# Patient Record
Sex: Male | Born: 1955
Health system: Southern US, Community
[De-identification: ages and names within clinical notes are randomized; demographics above are authoritative.]

## PROBLEM LIST (undated history)

## (undated) DIAGNOSIS — Z460 Encounter for fitting and adjustment of spectacles and contact lenses: Secondary | ICD-10-CM

## (undated) DIAGNOSIS — E785 Hyperlipidemia, unspecified: Secondary | ICD-10-CM

## (undated) DIAGNOSIS — N400 Enlarged prostate without lower urinary tract symptoms: Secondary | ICD-10-CM

## (undated) DIAGNOSIS — I10 Essential (primary) hypertension: Secondary | ICD-10-CM

## (undated) DIAGNOSIS — C801 Malignant (primary) neoplasm, unspecified: Secondary | ICD-10-CM

## (undated) HISTORY — PX: TONSILLECTOMY: SUR1361

## (undated) HISTORY — PX: COLONOSCOPY: SHX174

---

## 2002-04-03 ENCOUNTER — Ambulatory Visit (HOSPITAL_BASED_OUTPATIENT_CLINIC_OR_DEPARTMENT_OTHER): Admission: RE | Admit: 2002-04-03 | Discharge: 2002-04-03 | Payer: Self-pay | Admitting: Internal Medicine

## 2007-03-24 HISTORY — PX: ORIF ANKLE FRACTURE: SHX5408

## 2007-06-30 ENCOUNTER — Ambulatory Visit (HOSPITAL_COMMUNITY): Admission: RE | Admit: 2007-06-30 | Discharge: 2007-07-01 | Payer: Self-pay | Admitting: Orthopedic Surgery

## 2007-06-30 IMAGING — CR DG CHEST 2V
2 series · 2 of 2 positions shown · non-contrast
Comparison: None available

CLINICAL DATA: Left ankle fracture, preop

CHEST - 2 VIEW

[view not recorded (1 of 2)]
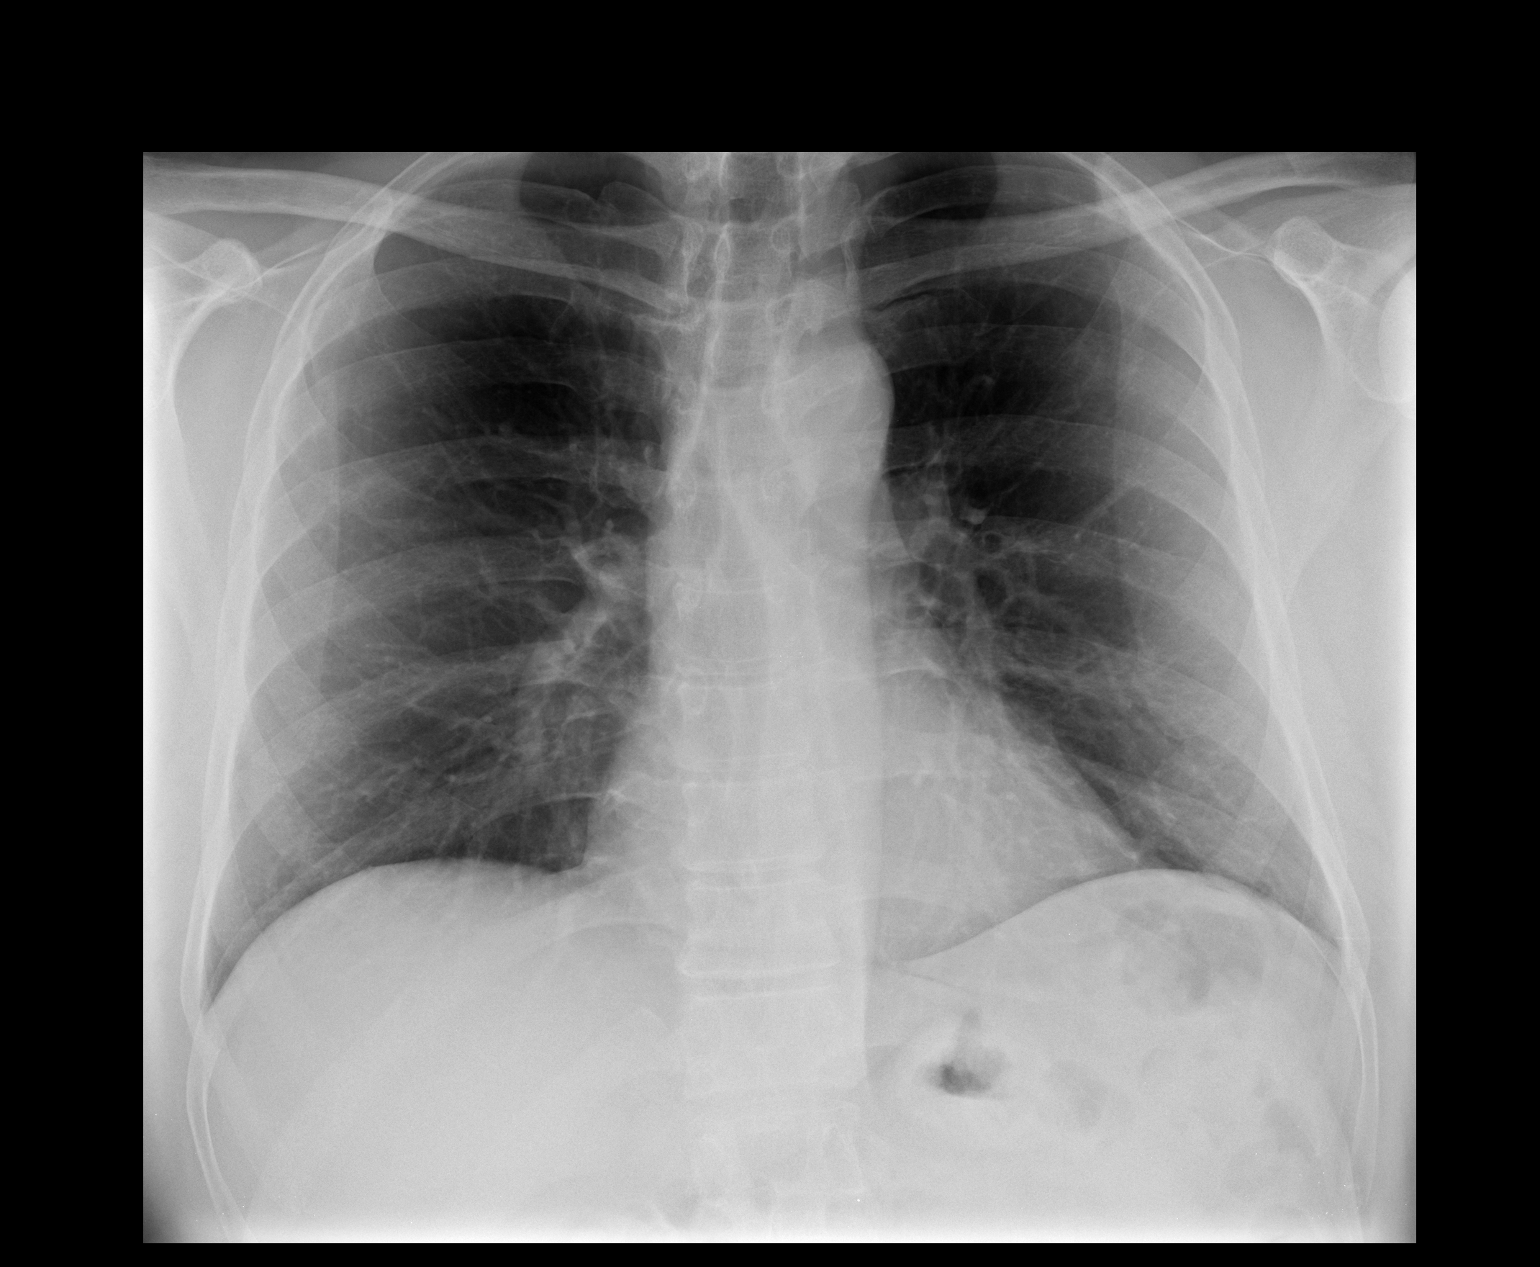

[view not recorded (2 of 2)]
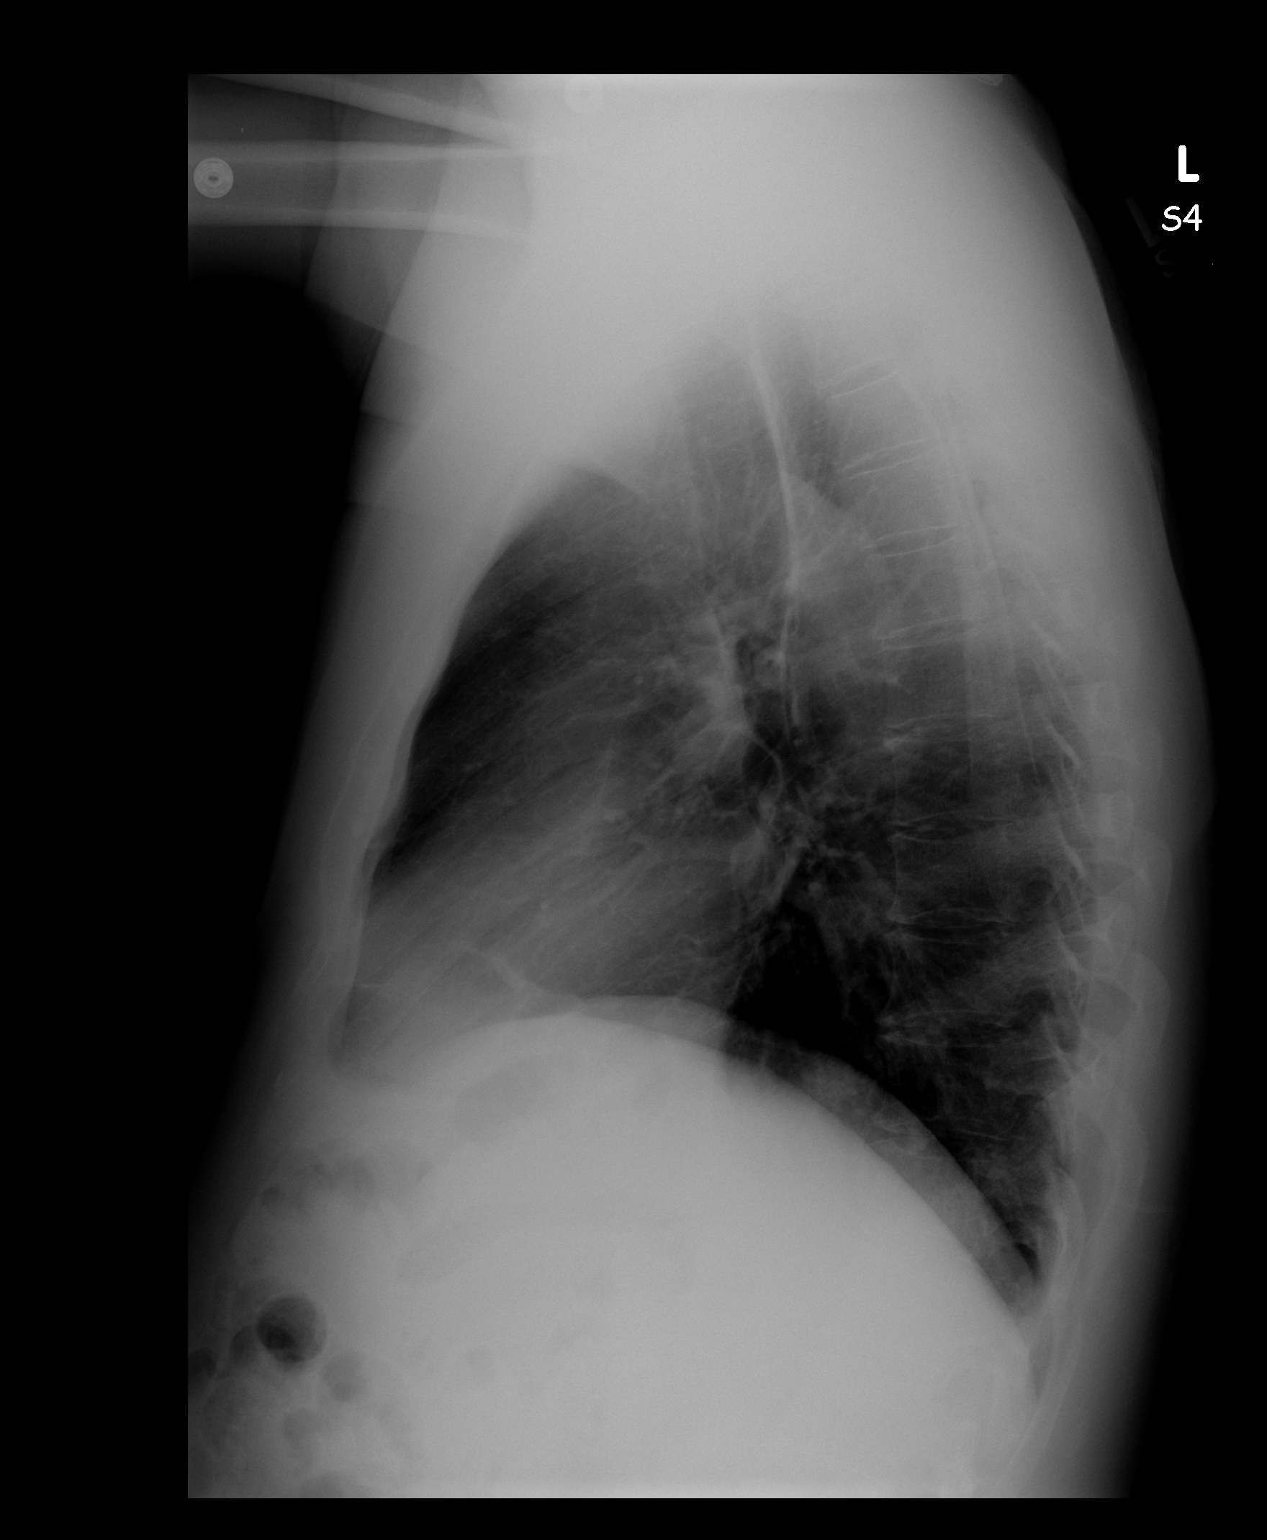

[2 of 2 positions shown; findings below may reference images not displayed]

FINDINGS: Lungs clear.  Heart size and pulmonary vascularity
normal.  No effusion.  Visualized bones unremarkable.]
IMPRESSION: [1.  No acute disease

## 2007-06-30 IMAGING — CR DG FOOT 2V*L*
1 series · 1 of 1 positions shown · non-contrast
Comparison: None

CLINICAL DATA: ORIF talar fracture

LEFT FOOT - 2 VIEW

[view not recorded]
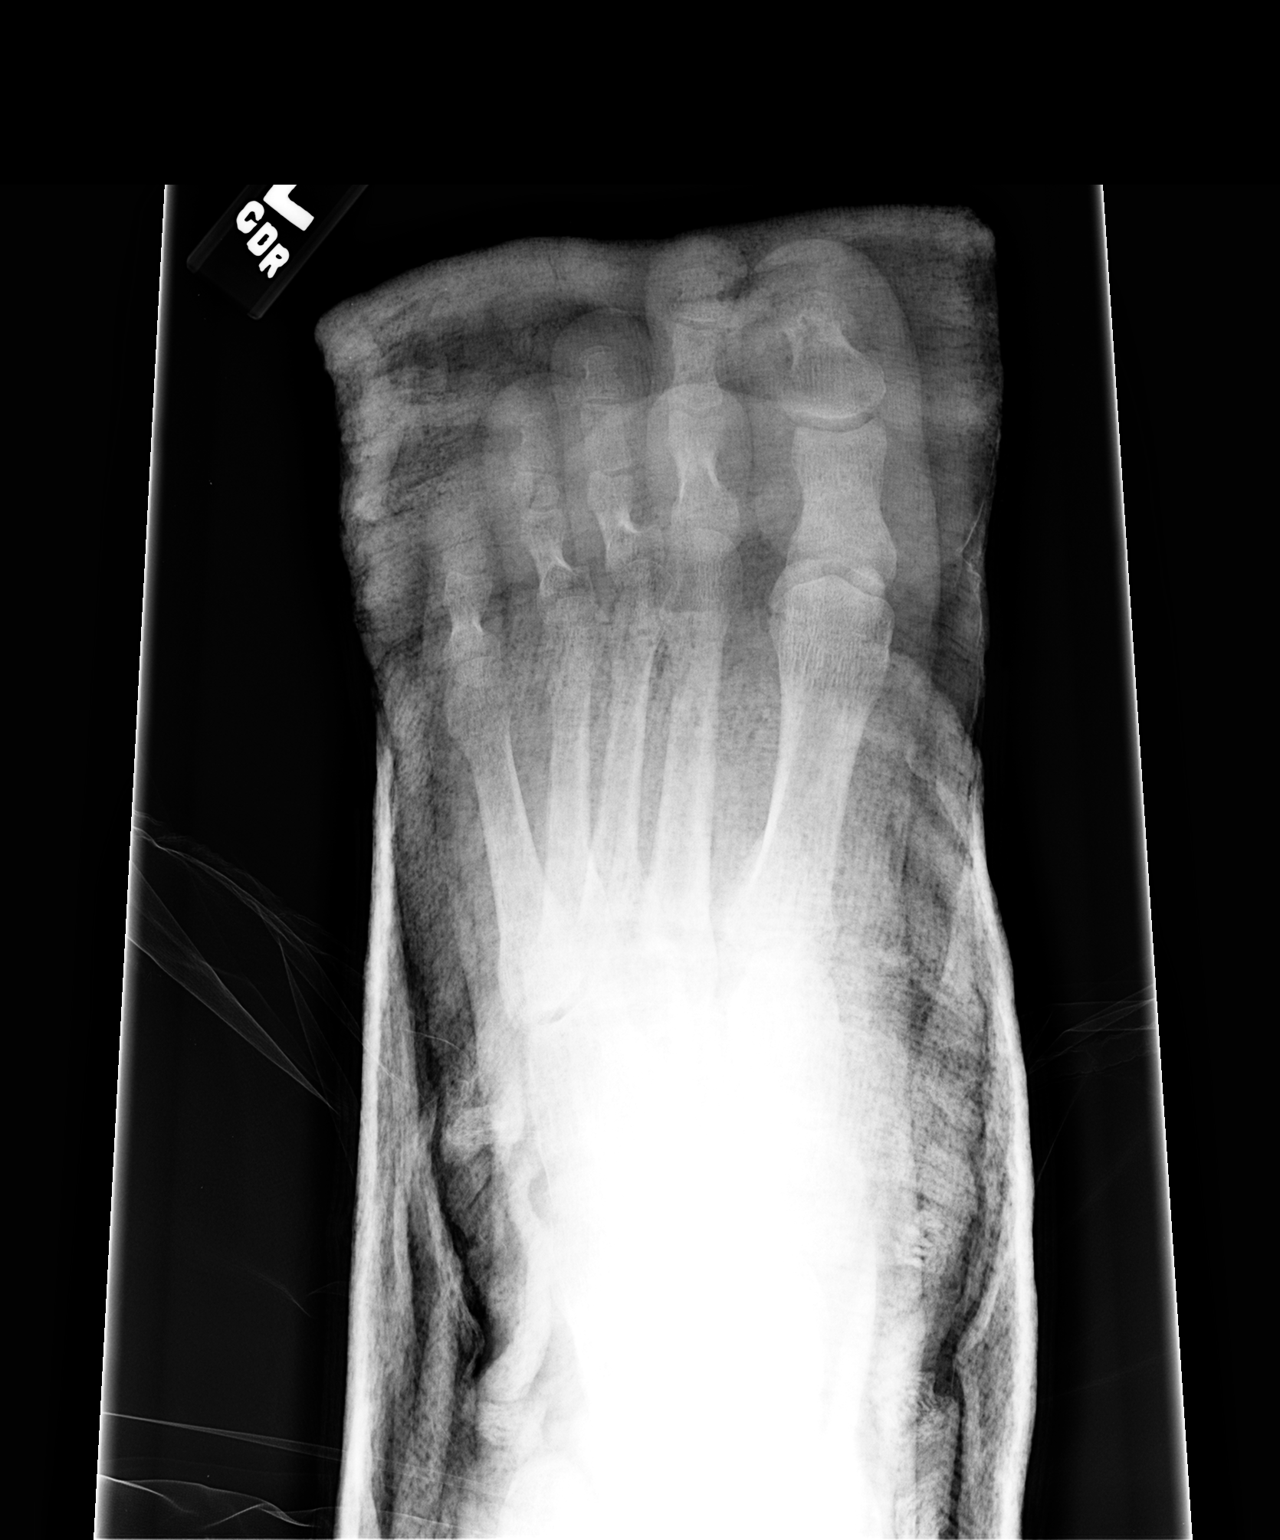

[1 of 1 positions shown; findings below may reference images not displayed]

FINDINGS: A single AP view of the foot is obtained.  The patient is
in a plaster splint.  Screw plate fixation of a talar fracture
identified.  The hardware is satisfactory appearance.
The alignment is anatomic
IMPRESSION: Satisfactory appearance status post ORIF left talar fracture.

## 2007-06-30 IMAGING — CR DG ANKLE PORT 2V*L*
1 series · 1 of 1 positions shown · non-contrast
Comparison: None

CLINICAL DATA: ORIF left talar fracture.

PORTABLE LEFT ANKLE - 2 VIEW

[view not recorded]
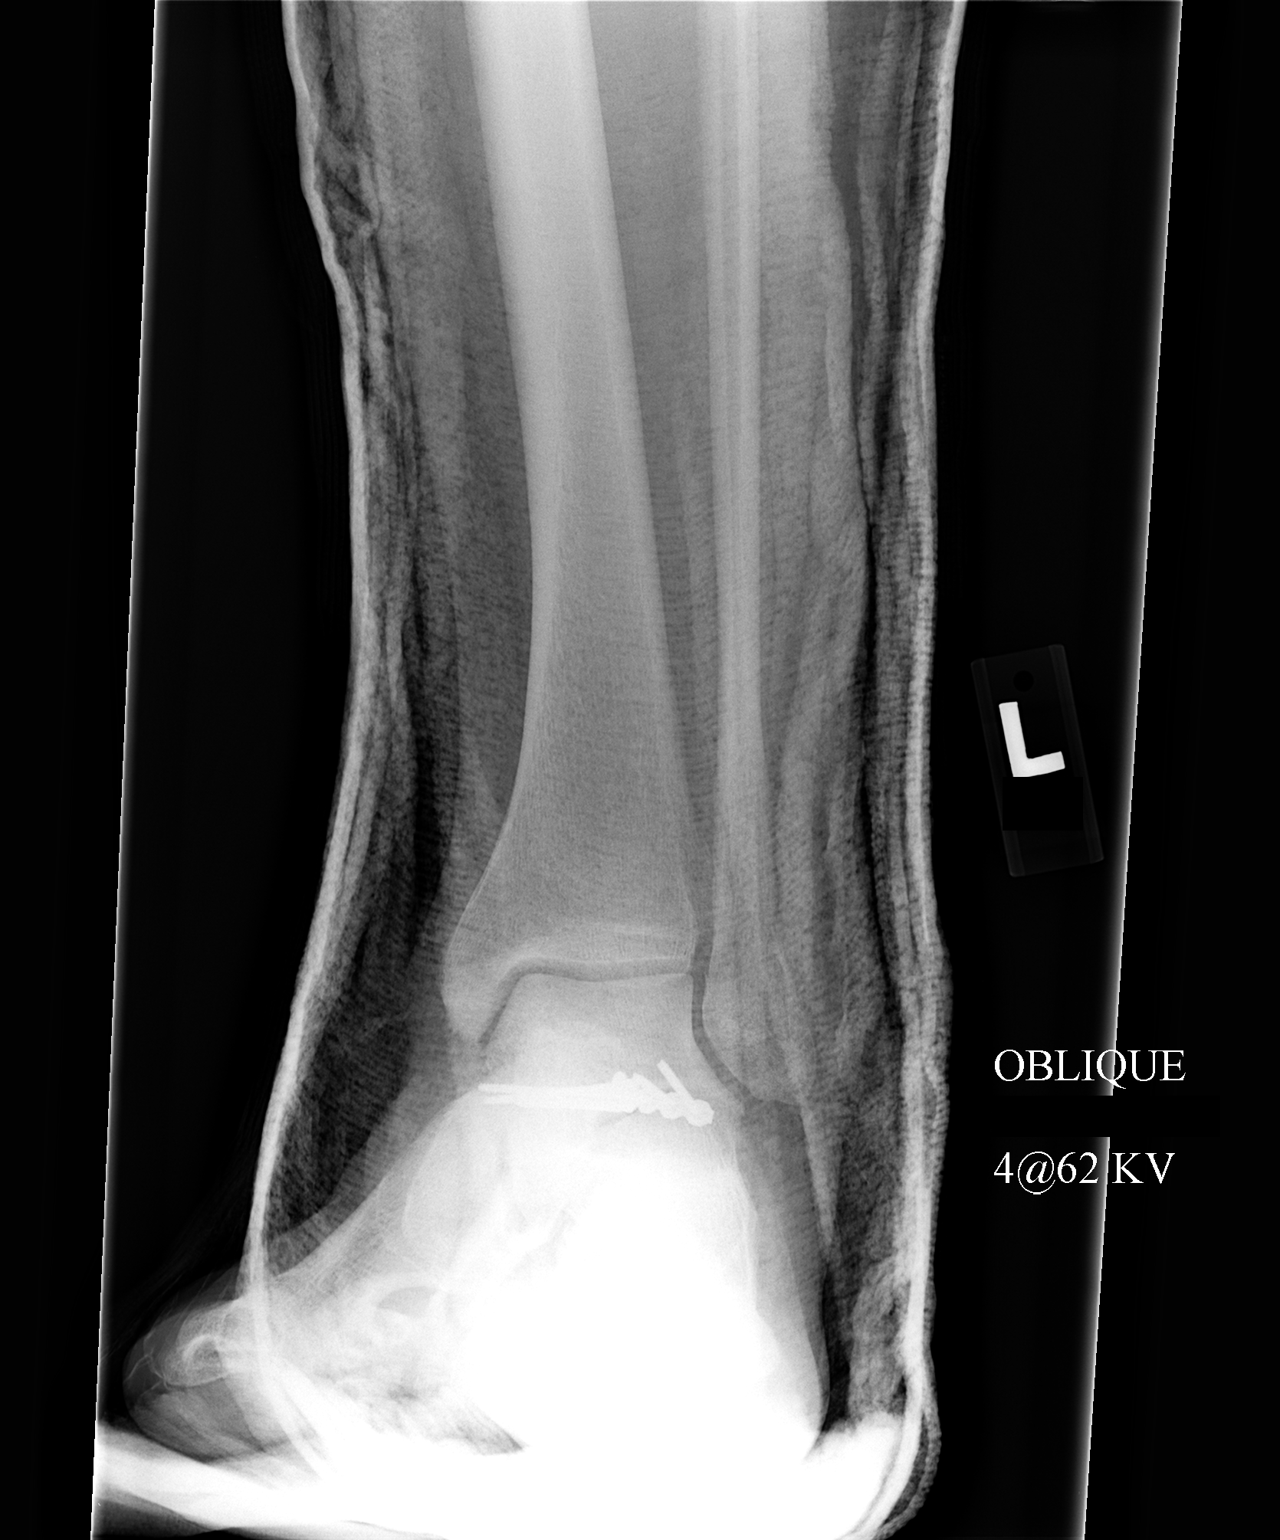

[1 of 1 positions shown; findings below may reference images not displayed]

FINDINGS: The patient is in a plaster splint.  Screw plate fixation
of the talus is identified.  The alignment anatomic.  The
tibiotalar articulation is normal without joint effusion.
IMPRESSION: Satisfactory appearance status post ORIF talar neck fracture.

## 2007-06-30 IMAGING — CR DG ANKLE PORT 2V*L*
1 series · 1 of 1 positions shown · non-contrast
Comparison: None

CLINICAL DATA: ORIF left talar fracture.

PORTABLE LEFT ANKLE - 2 VIEW

[view not recorded]
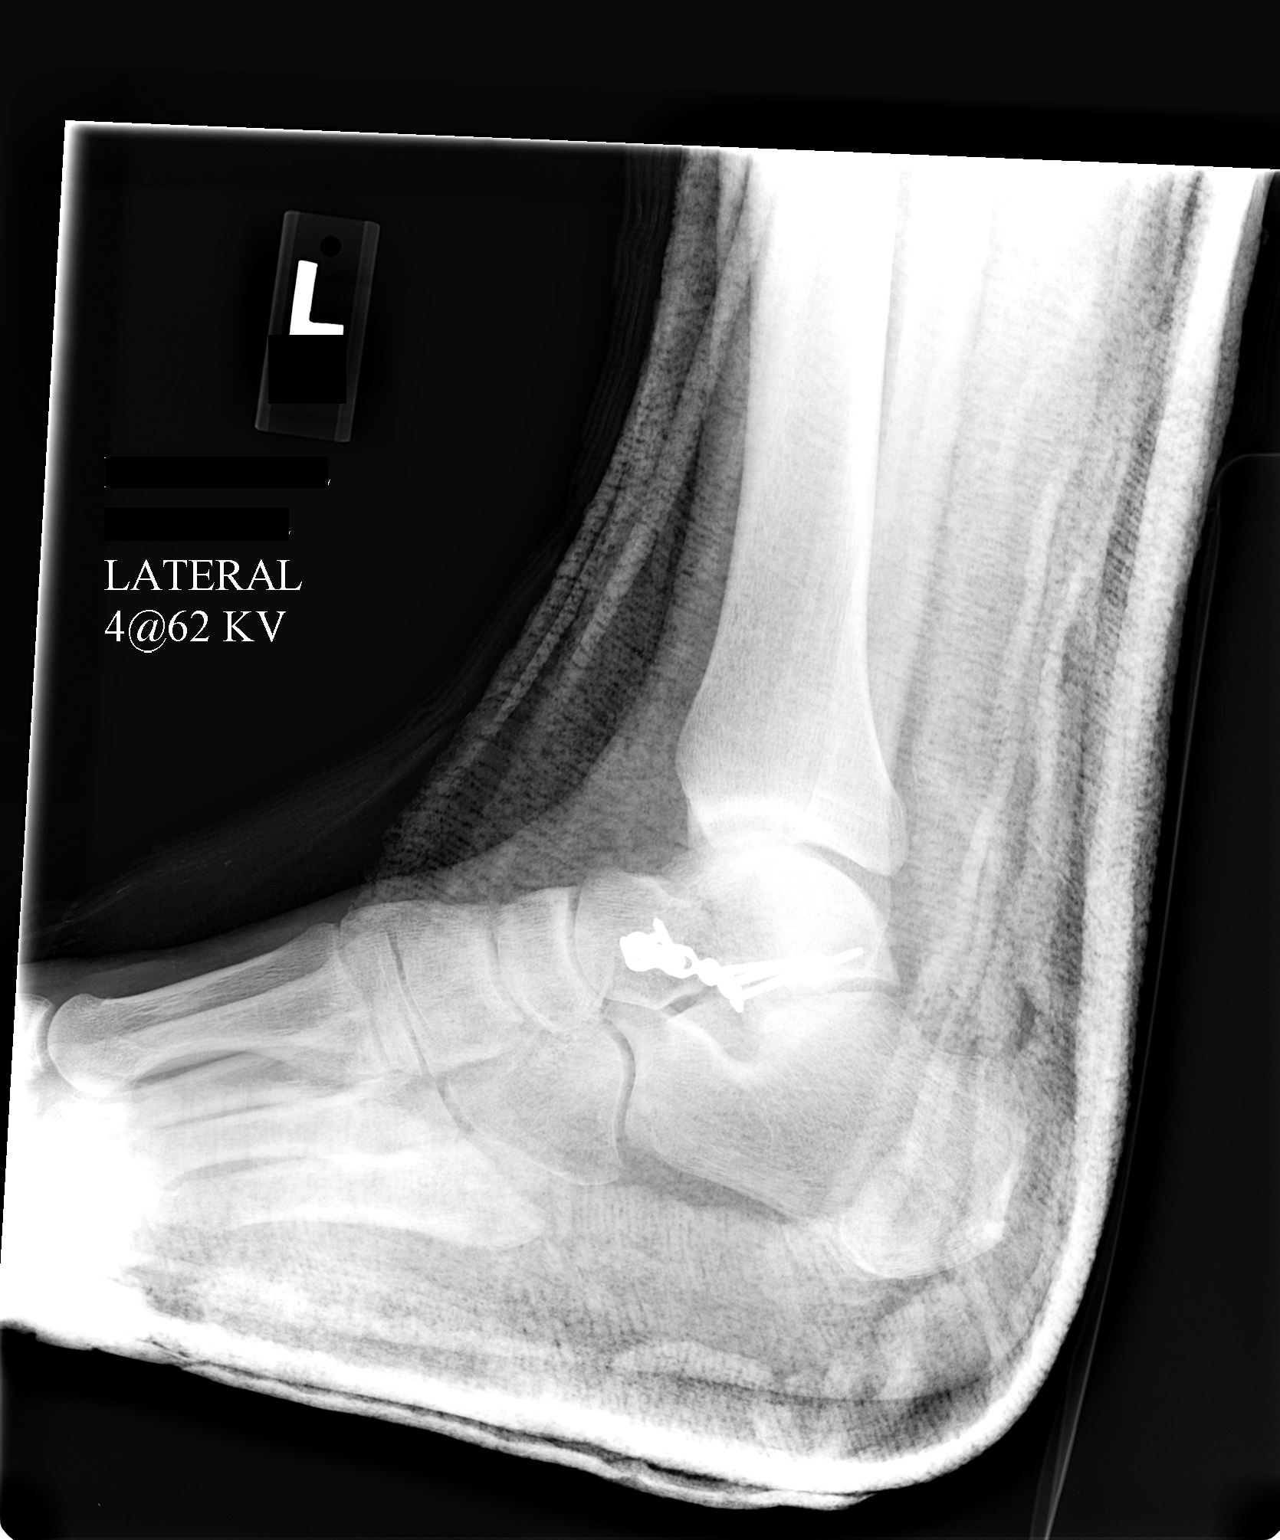

[1 of 1 positions shown; findings below may reference images not displayed]

FINDINGS: The patient is in a plaster splint.  Screw plate fixation
of the talus is identified.  The alignment anatomic.  The
tibiotalar articulation is normal without joint effusion.
IMPRESSION: Satisfactory appearance status post ORIF talar neck fracture.

## 2007-06-30 IMAGING — RF DG ANKLE COMPLETE 3+V*L*
1 series · 6 of 6 positions shown · non-contrast
Comparison: None

CLINICAL DATA: Left talus fracture

LEFT ANKLE COMPLETE - 3+ VIEW

[Series 1: run · 6 of 6 slices shown]
[im 1/6]
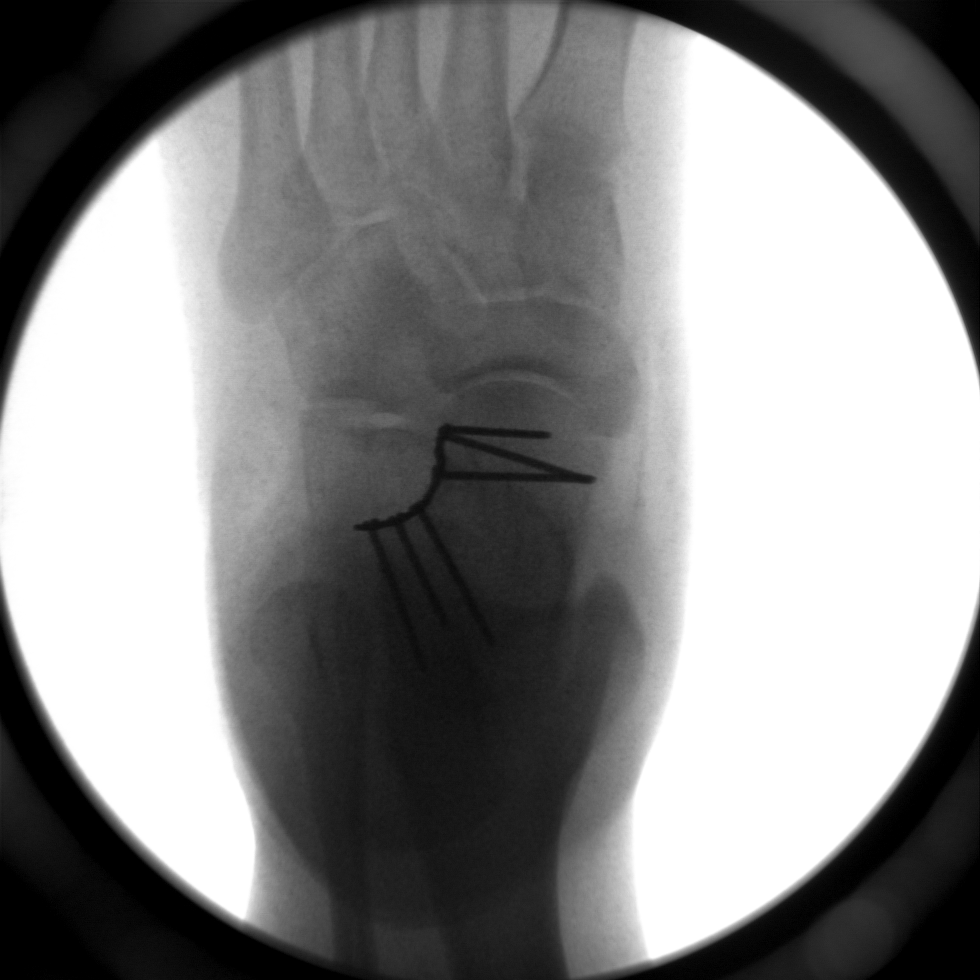
[im 2/6]
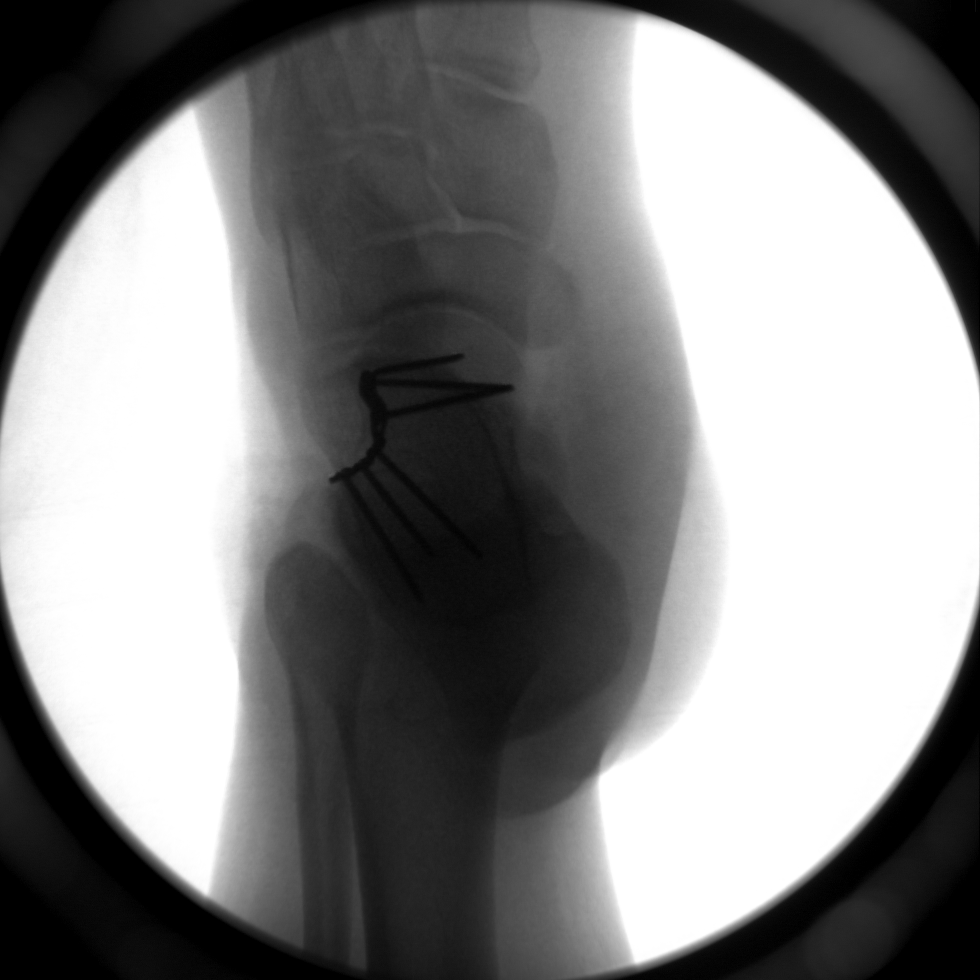
[im 3/6]
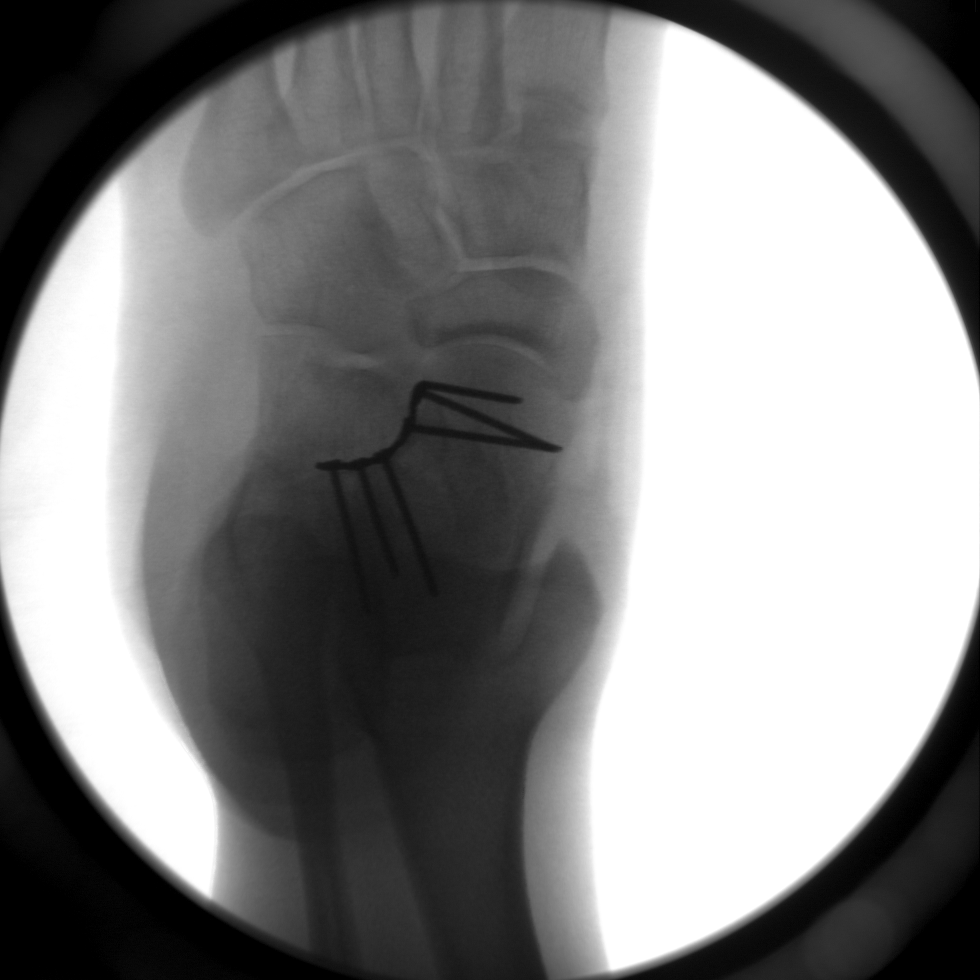
[im 4/6]
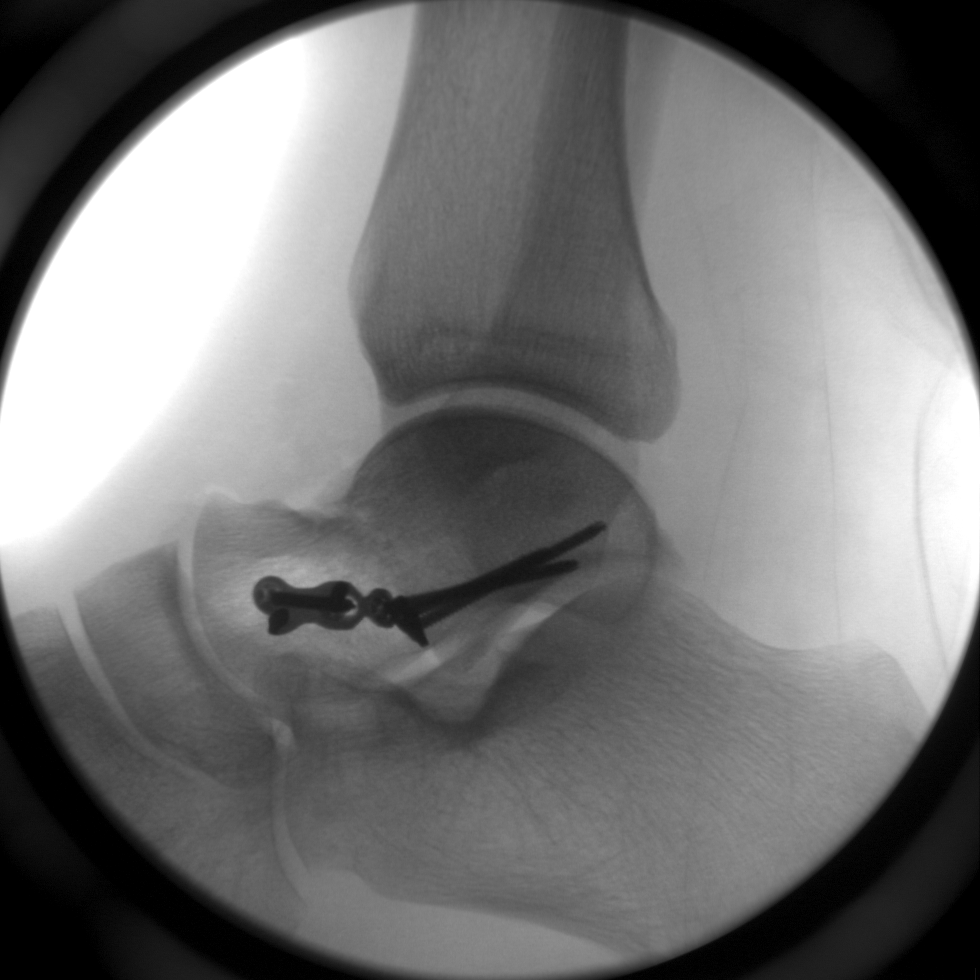
[im 5/6]
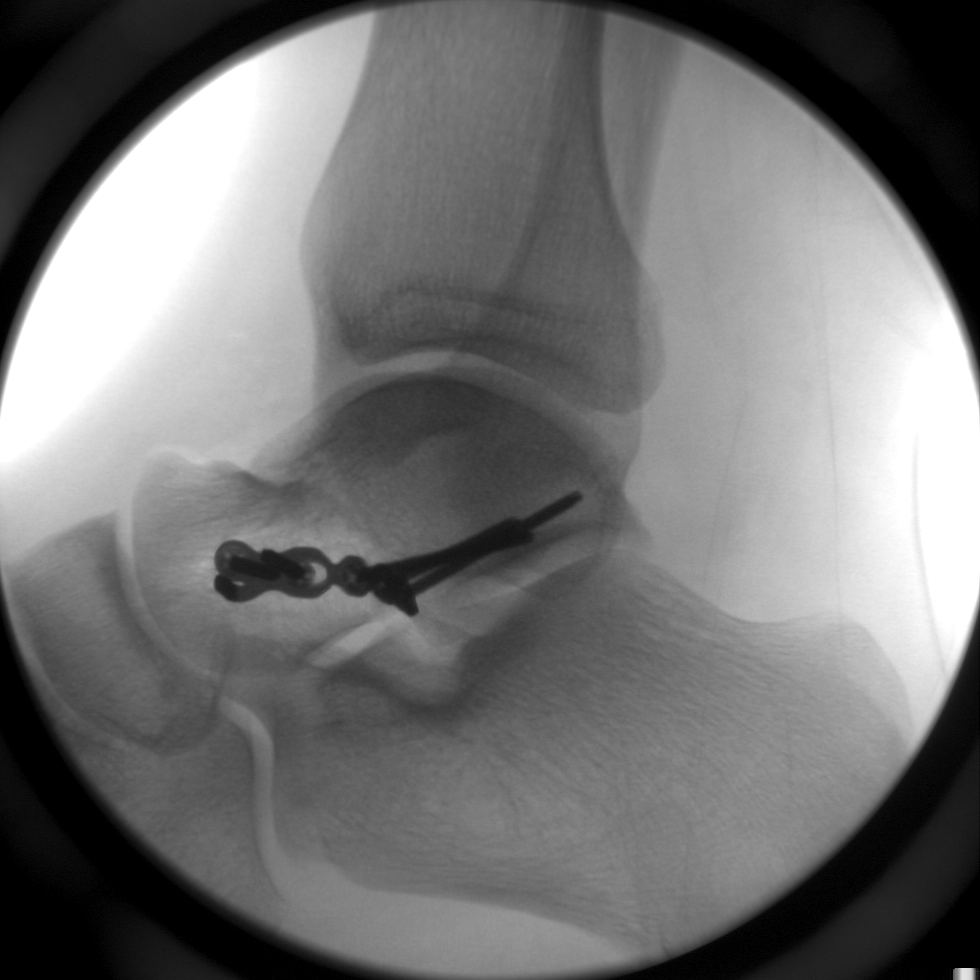
[im 6/6]
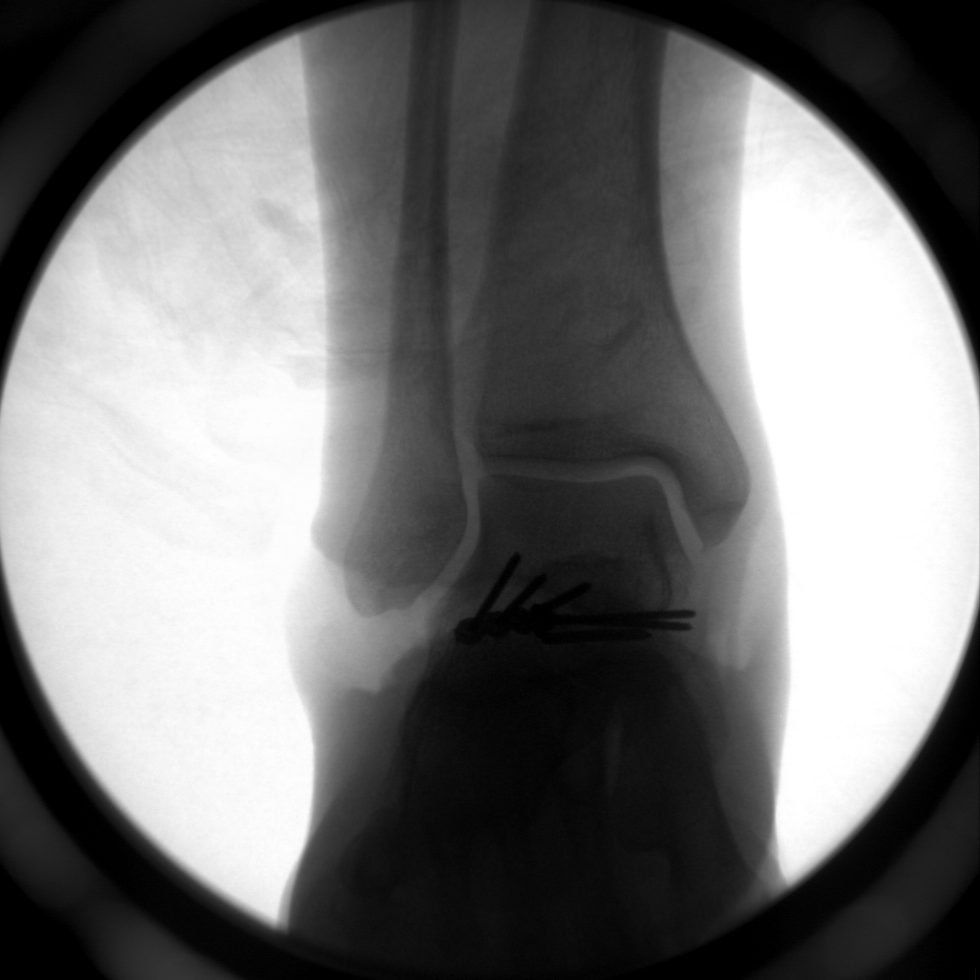

[6 of 6 positions shown; findings below may reference images not displayed]

FINDINGS: There is a mini plate and screws transfixing a talar neck
fracture.  Anatomic alignment and no complicating features.
IMPRESSION: 1.  Internal fixation of the talar fracture with anatomic alignment
and no complicating features.

## 2007-08-16 ENCOUNTER — Encounter: Admission: RE | Admit: 2007-08-16 | Discharge: 2007-08-16 | Payer: Self-pay | Admitting: Orthopedic Surgery

## 2007-08-16 IMAGING — CR DG FOOT 2V*L*
2 series · 2 of 2 positions shown · non-contrast
Comparison: Postop films of [DATE].

CLINICAL DATA: .

LEFT FOOT - 2 VIEW

[view not recorded (1 of 2)]
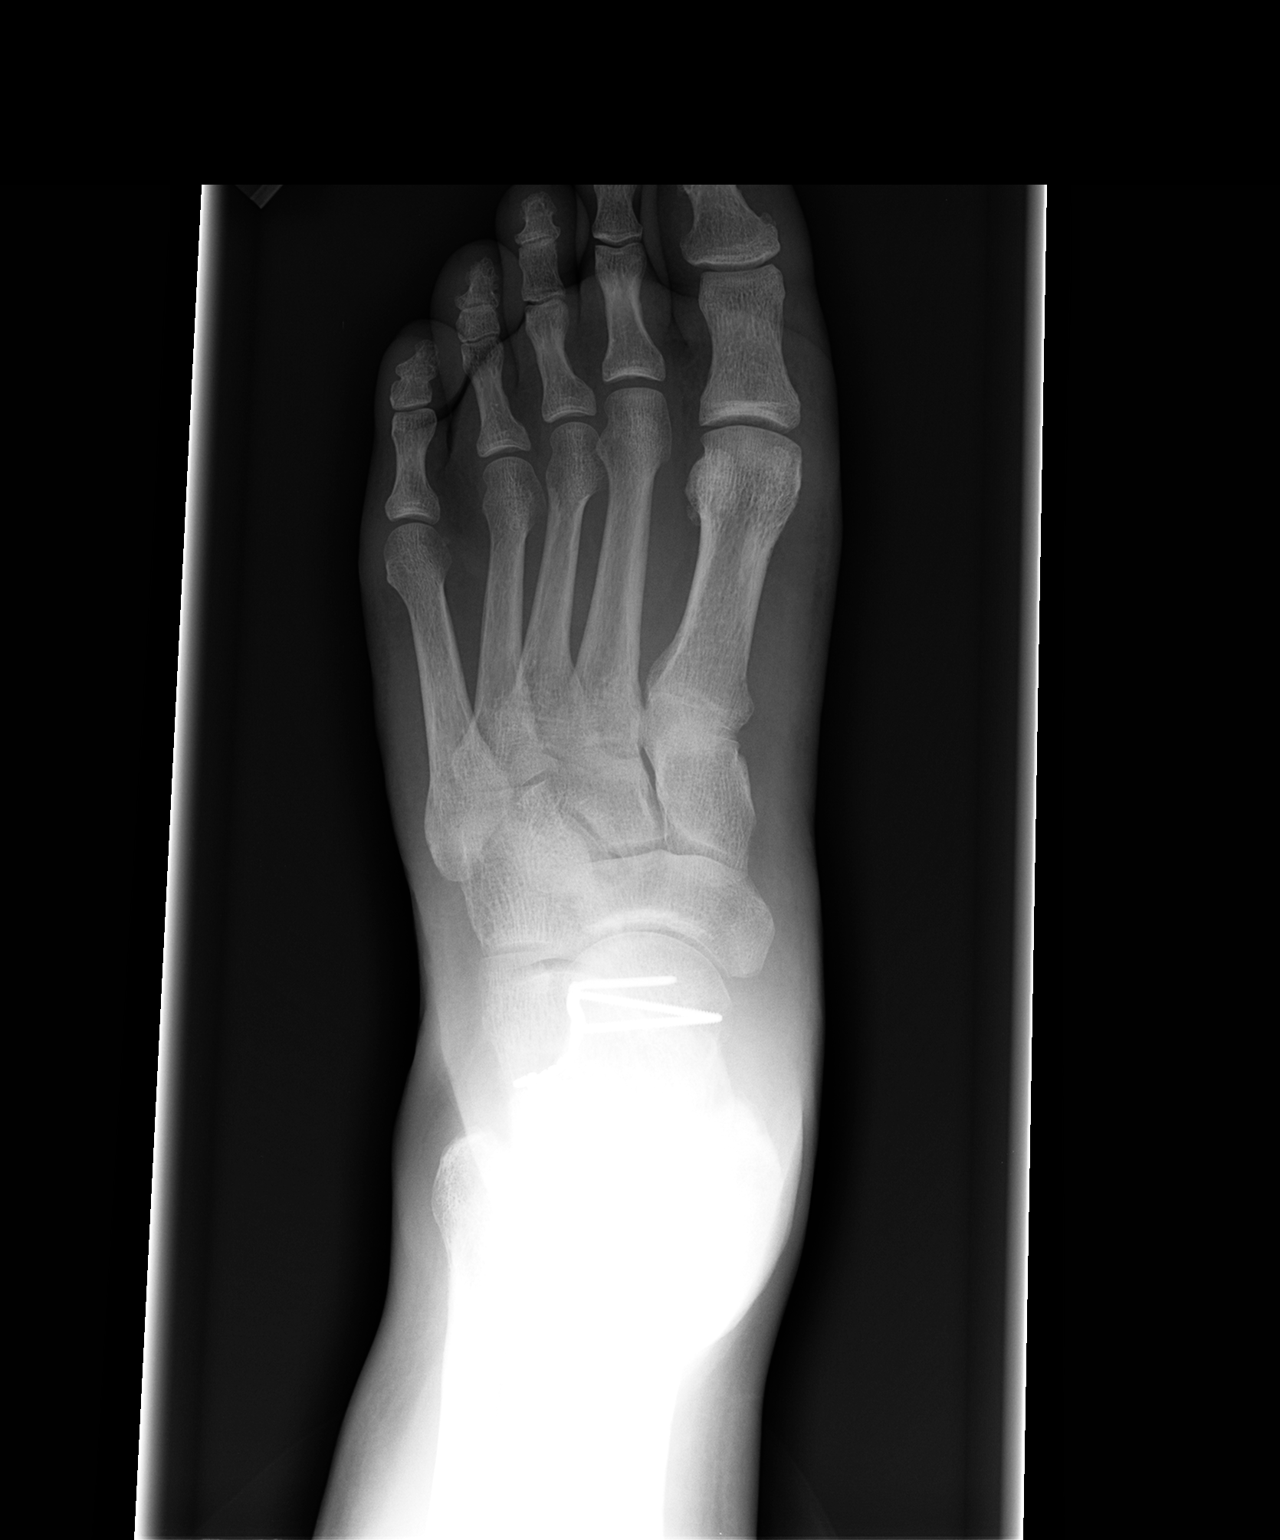

[view not recorded (2 of 2)]
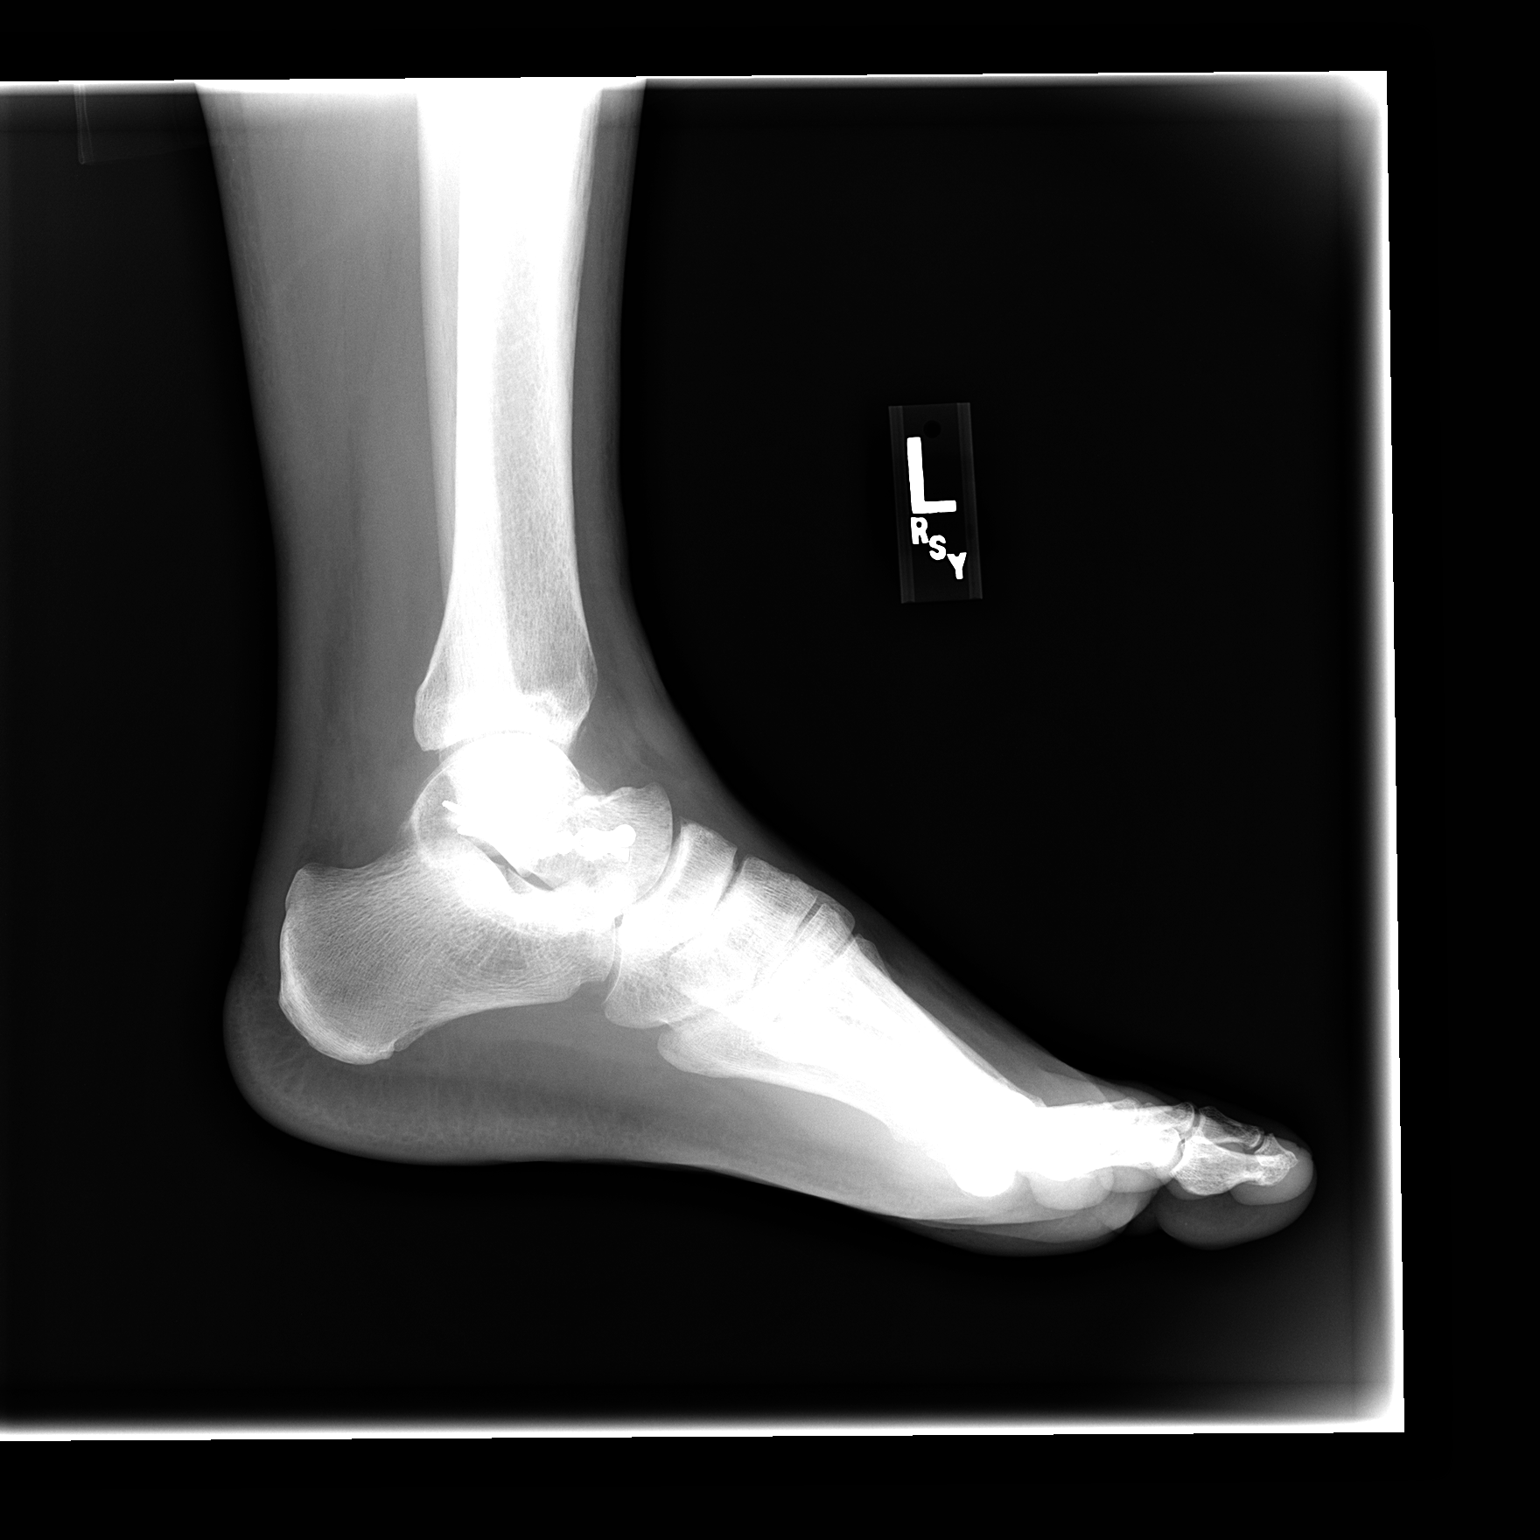

[2 of 2 positions shown; findings below may reference images not displayed]

FINDINGS: Plate and screw fixation of the mid talar fracture is
noted.  No change in alignment is seen.  There is some sclerosis
along the fracture line most likely due to some interval healing.
No other acute abnormality is seen
IMPRESSION: Some sclerosis along the talar fracture site most likely due to
some interval healing.  No change in appearance of the talar
fracture after fixation.

## 2007-08-16 IMAGING — CR DG ANKLE 2V *L*
2 series · 2 of 2 positions shown · non-contrast
Comparison: None

CLINICAL DATA: History of recent left talus fracture, evaluate for
healing,  some pain and swelling

LEFT ANKLE - 2 VIEW

[view not recorded (1 of 2)]
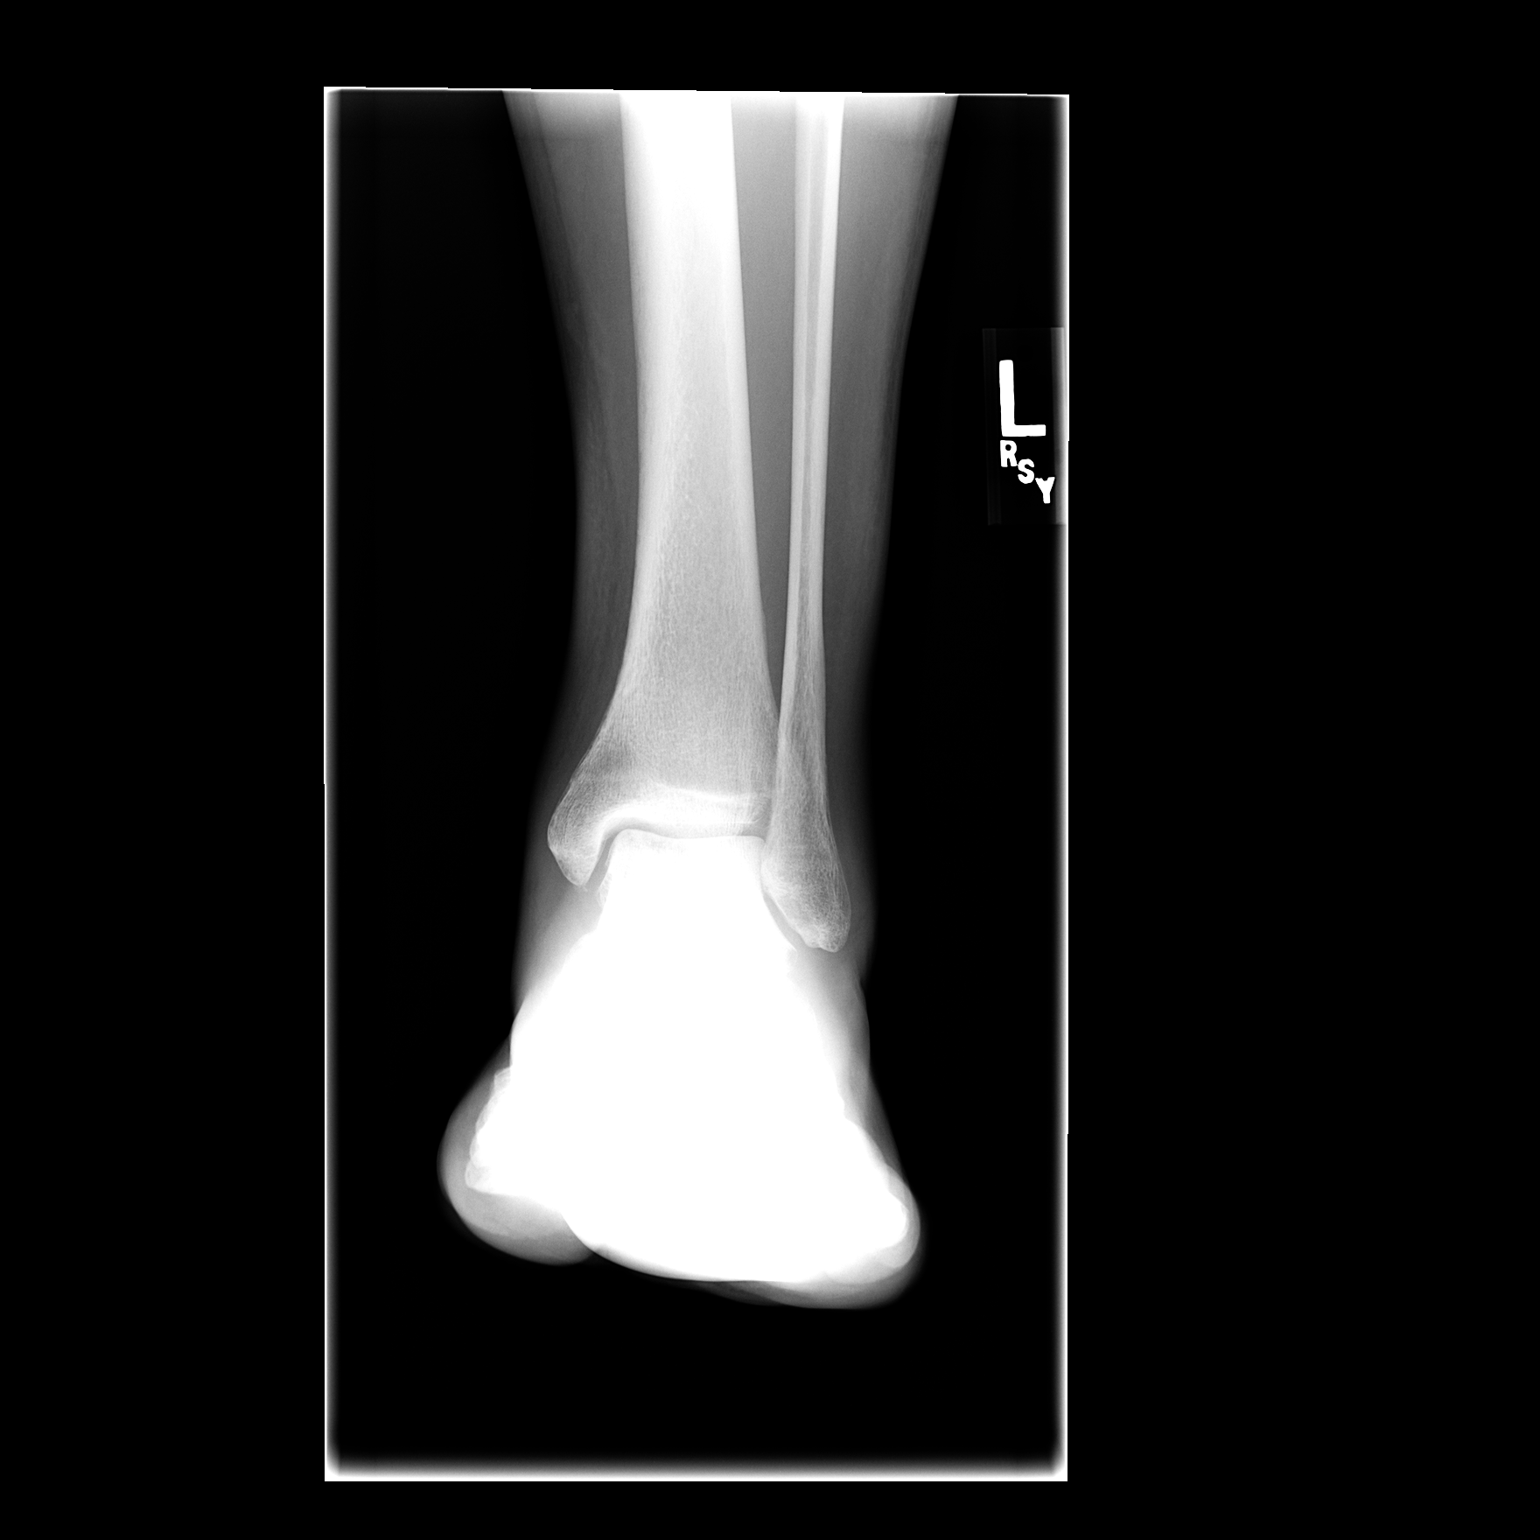

[view not recorded (2 of 2)]
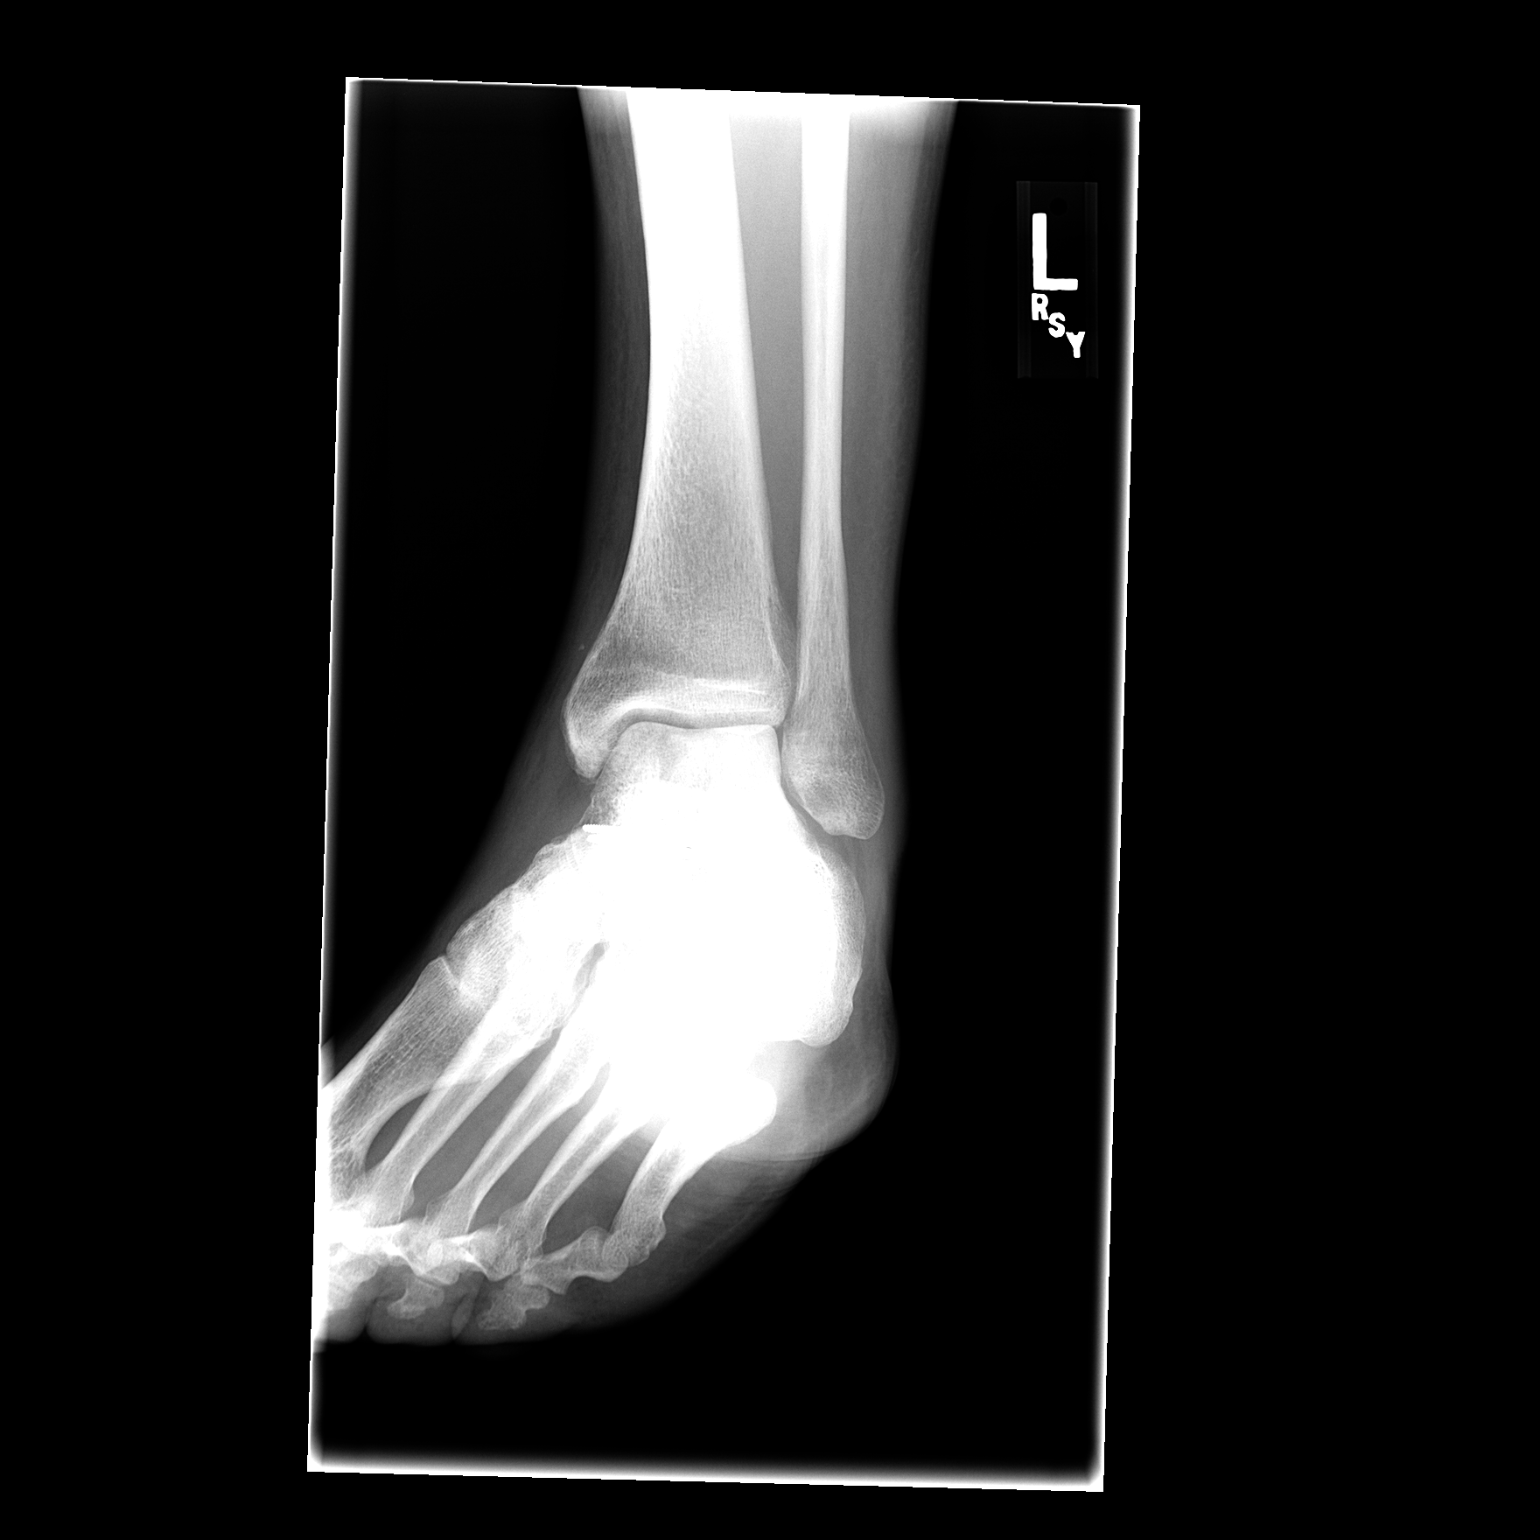

[2 of 2 positions shown; findings below may reference images not displayed]

FINDINGS: Two views of the left ankle show plate and screws for
fixation of the talar fracture.  The ankle joint appears normal.
There is some demineralization of the distal left tibia and fibula
most likely due to disuse.  Normal alignment is present on the
views obtained.
IMPRESSION: Mild osteopenia with ankle most likely due to disuse.  The ankle
joint appears normal.

## 2007-09-02 ENCOUNTER — Encounter: Admission: RE | Admit: 2007-09-02 | Discharge: 2007-09-02 | Payer: Self-pay | Admitting: Orthopedic Surgery

## 2007-09-02 IMAGING — CR DG ANKLE 2V *L*
1 series · 1 of 1 positions shown · non-contrast
Comparison: Films of [DATE]

CLINICAL DATA: Left talar fracture

LEFT ANKLE - 2 VIEW

[view not recorded]
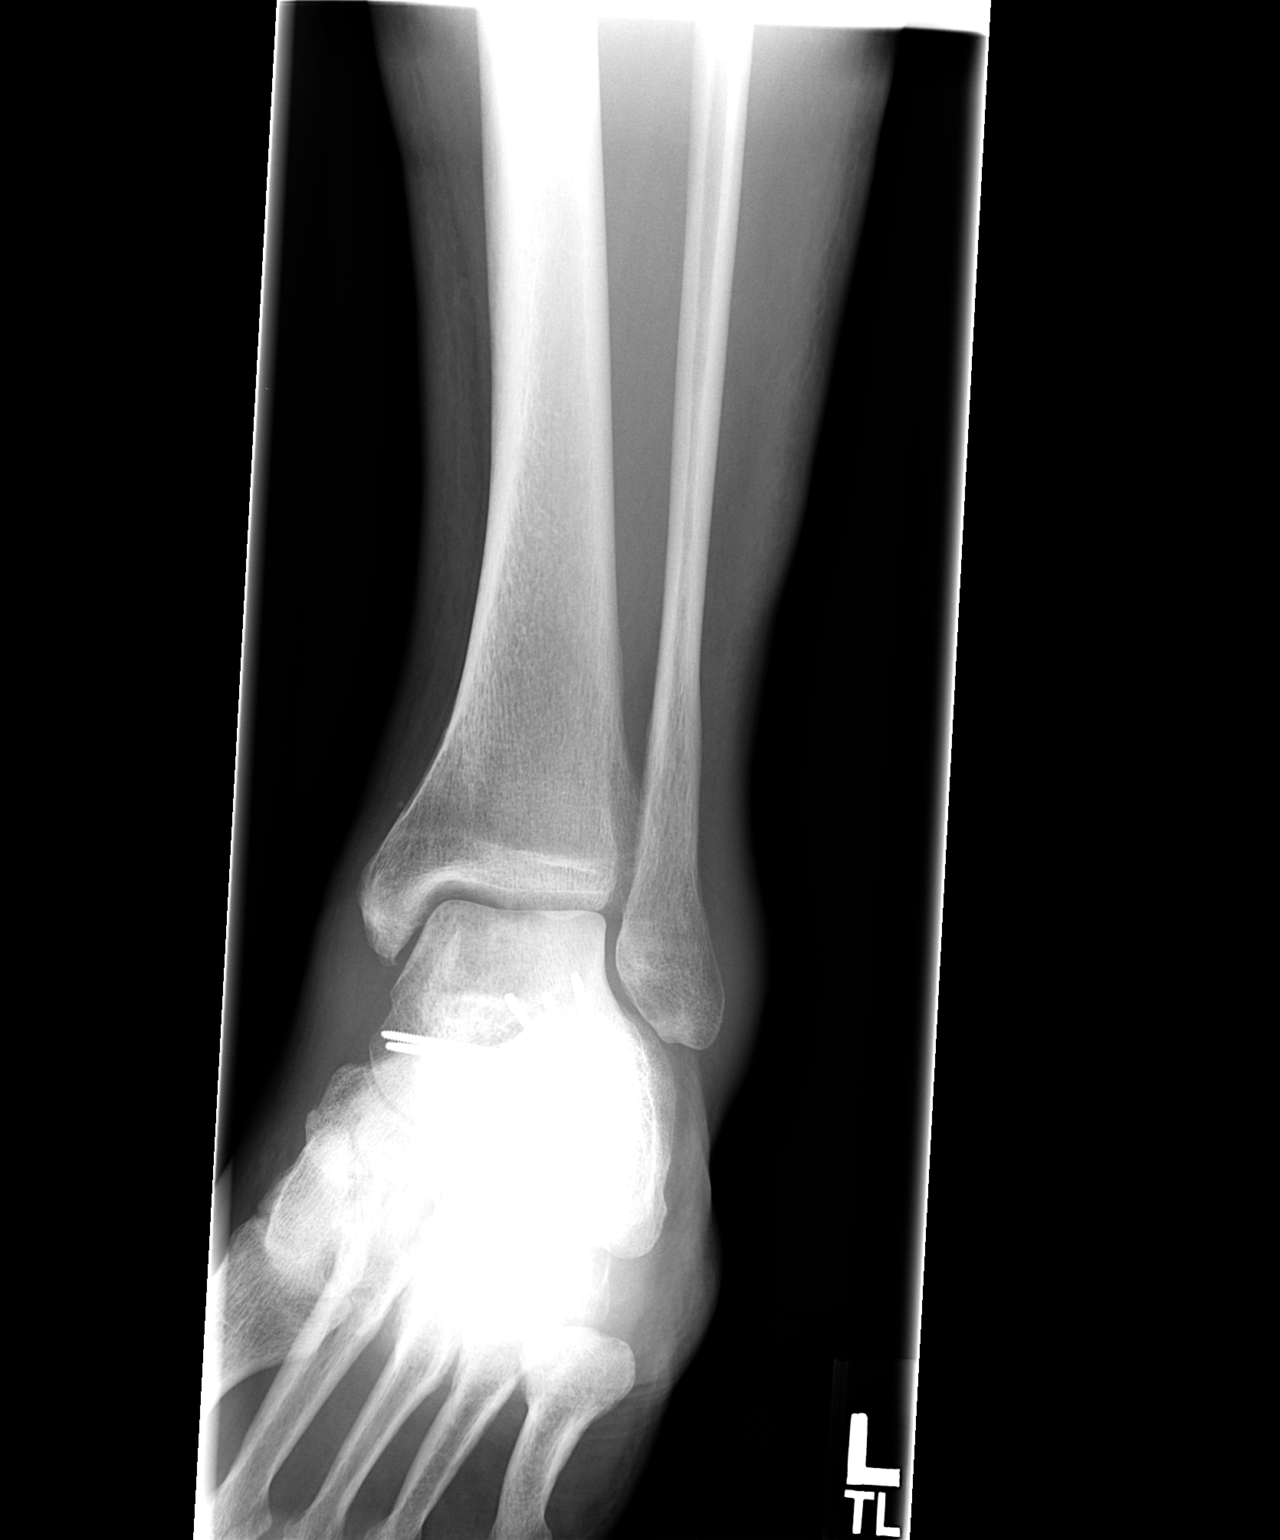

[1 of 1 positions shown; findings below may reference images not displayed]

FINDINGS: Mild osteopenia is noted.  Only a single view was not
requested.  Plate and screw fixation of the mid talar fracture is
noted.  The ankle joint appears normal.
IMPRESSION: Plate and screw fixation of mid talar fracture.  Mild osteopenia.

## 2007-09-02 IMAGING — CR DG FOOT COMPLETE 3+V*L*
3 series · 3 of 3 positions shown · non-contrast
Comparison: Ankle films of [DATE]

CLINICAL DATA: History of talar fracture [DATE], follow-up

LEFT FOOT - COMPLETE 3+ VIEW

[view not recorded (1 of 3)]
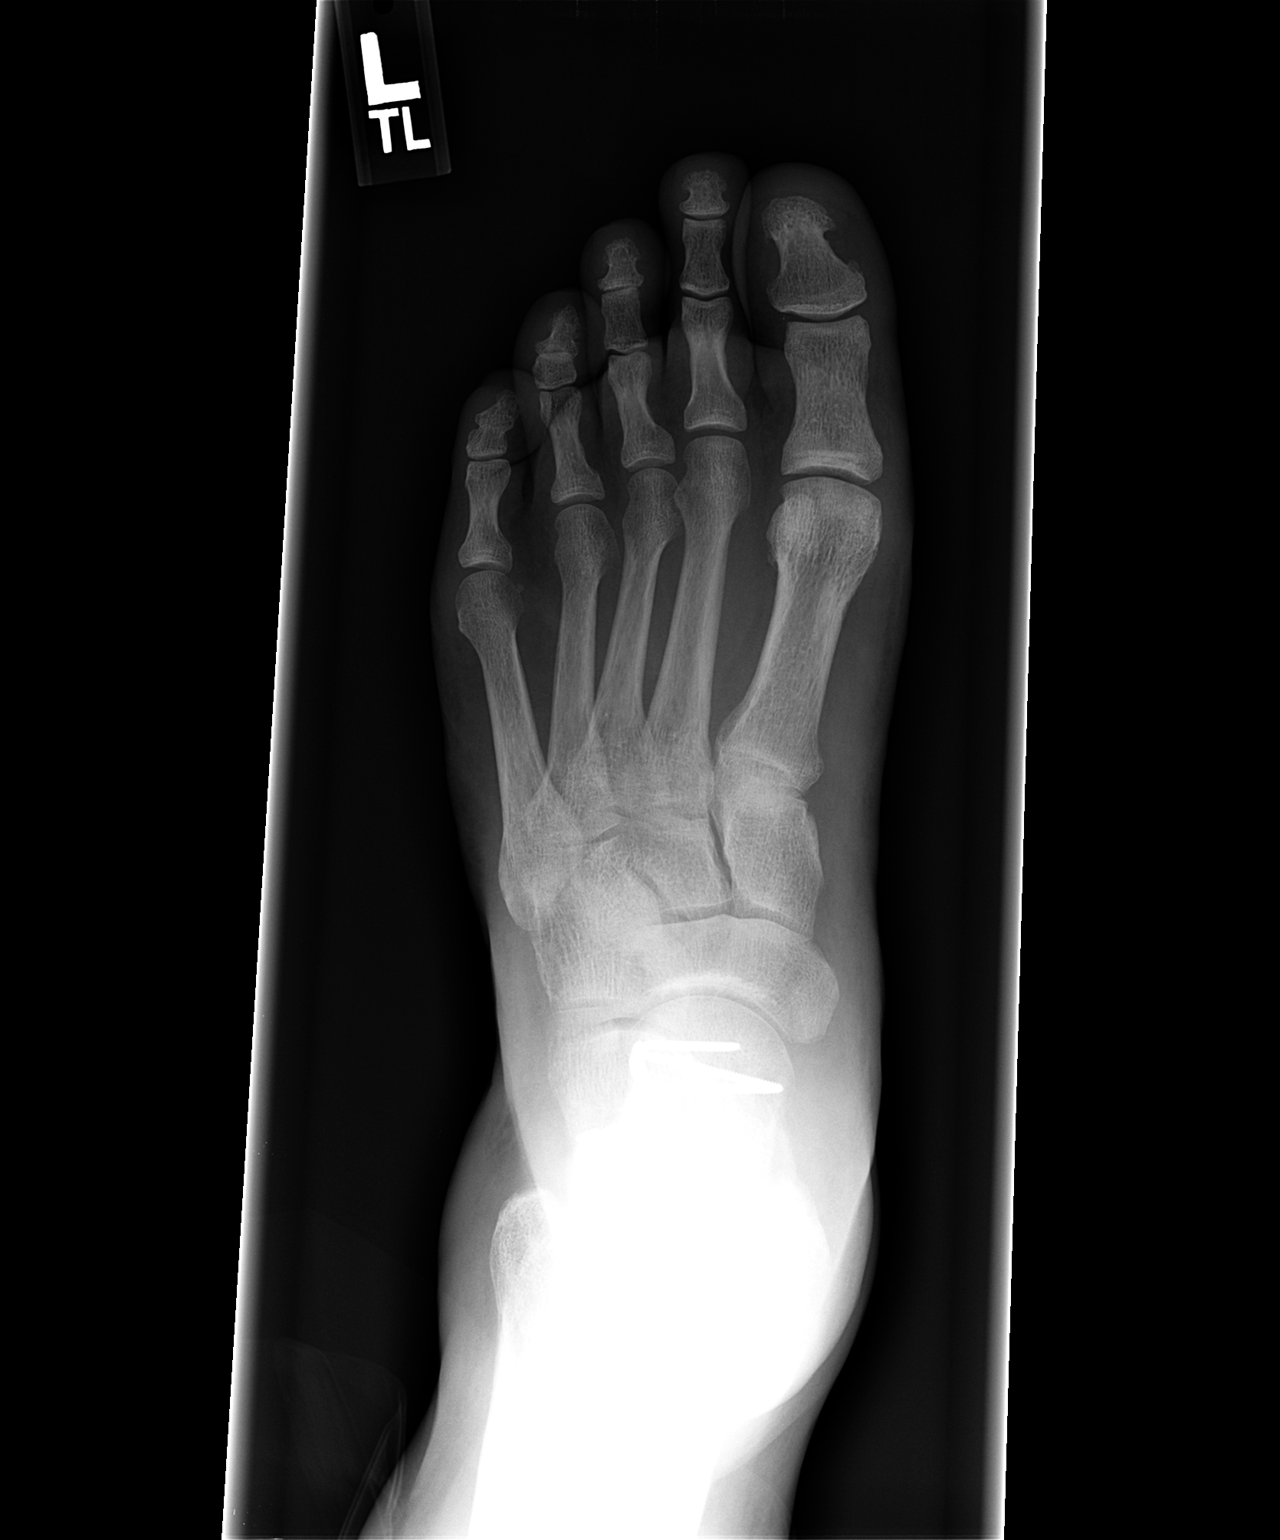

[view not recorded (2 of 3)]
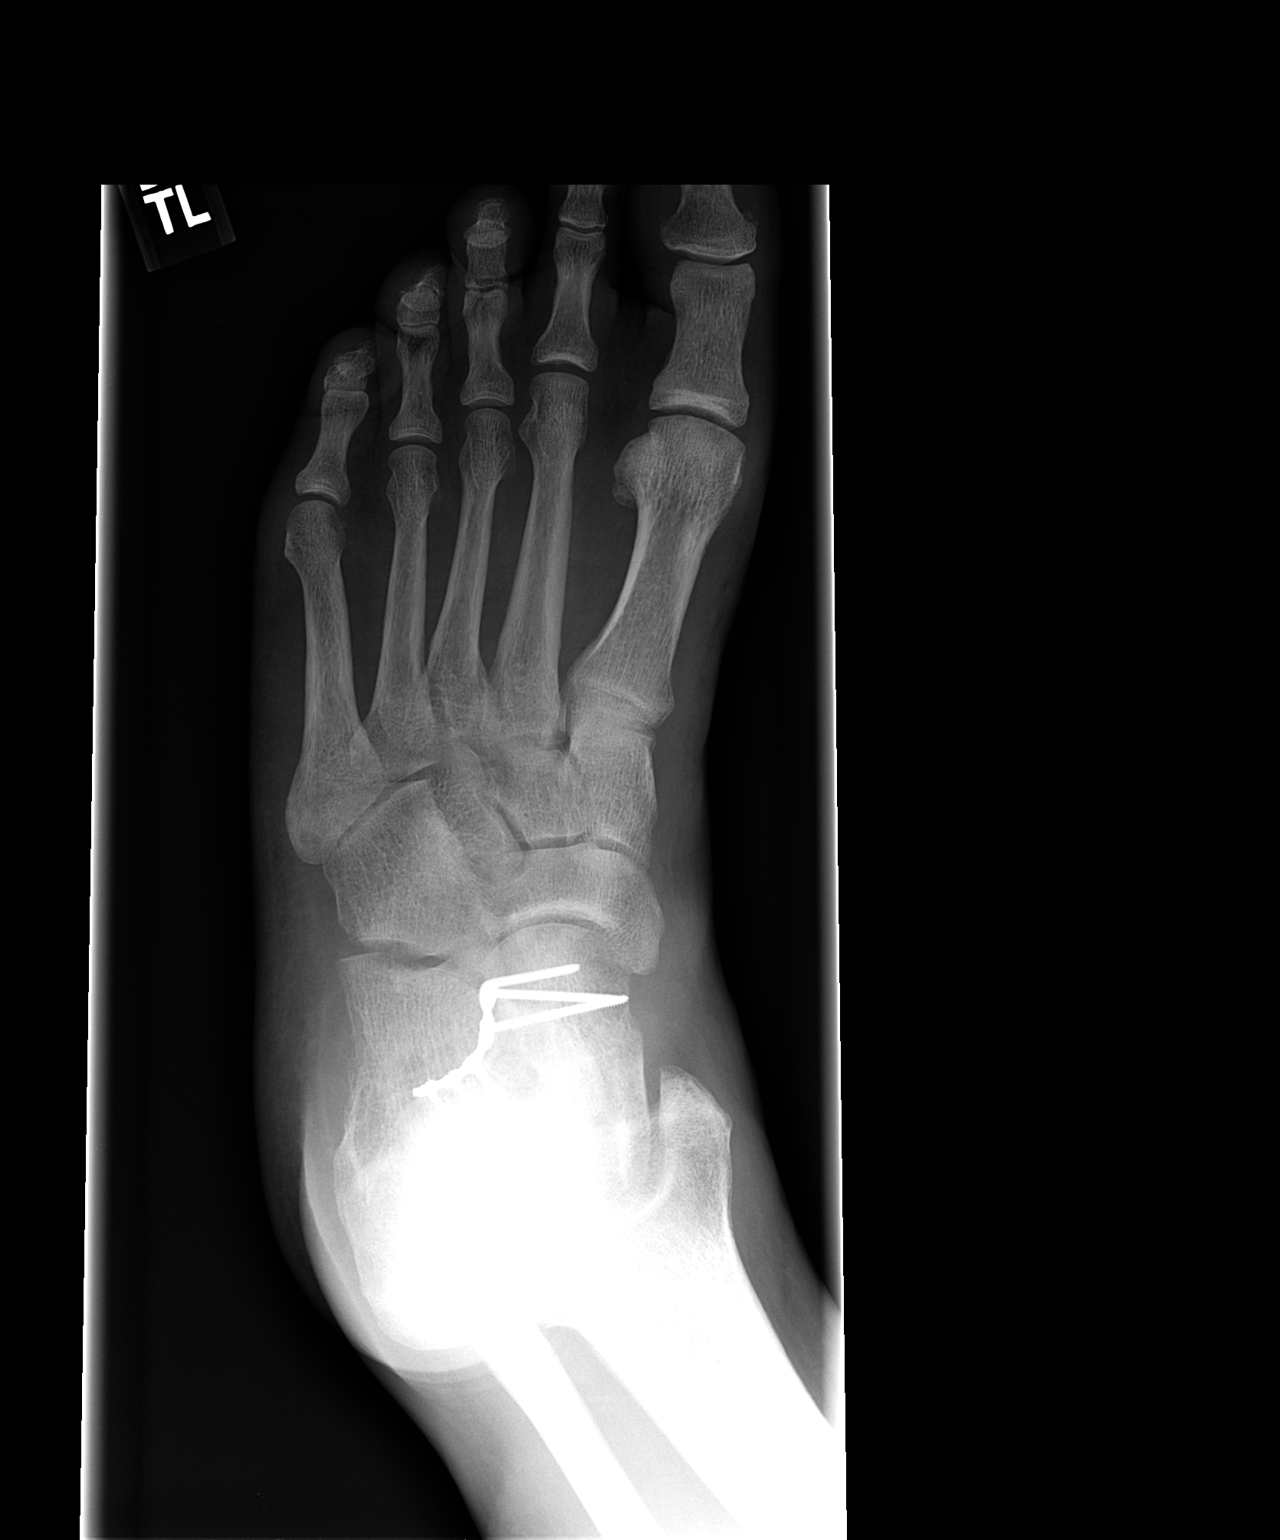

[view not recorded (3 of 3)]
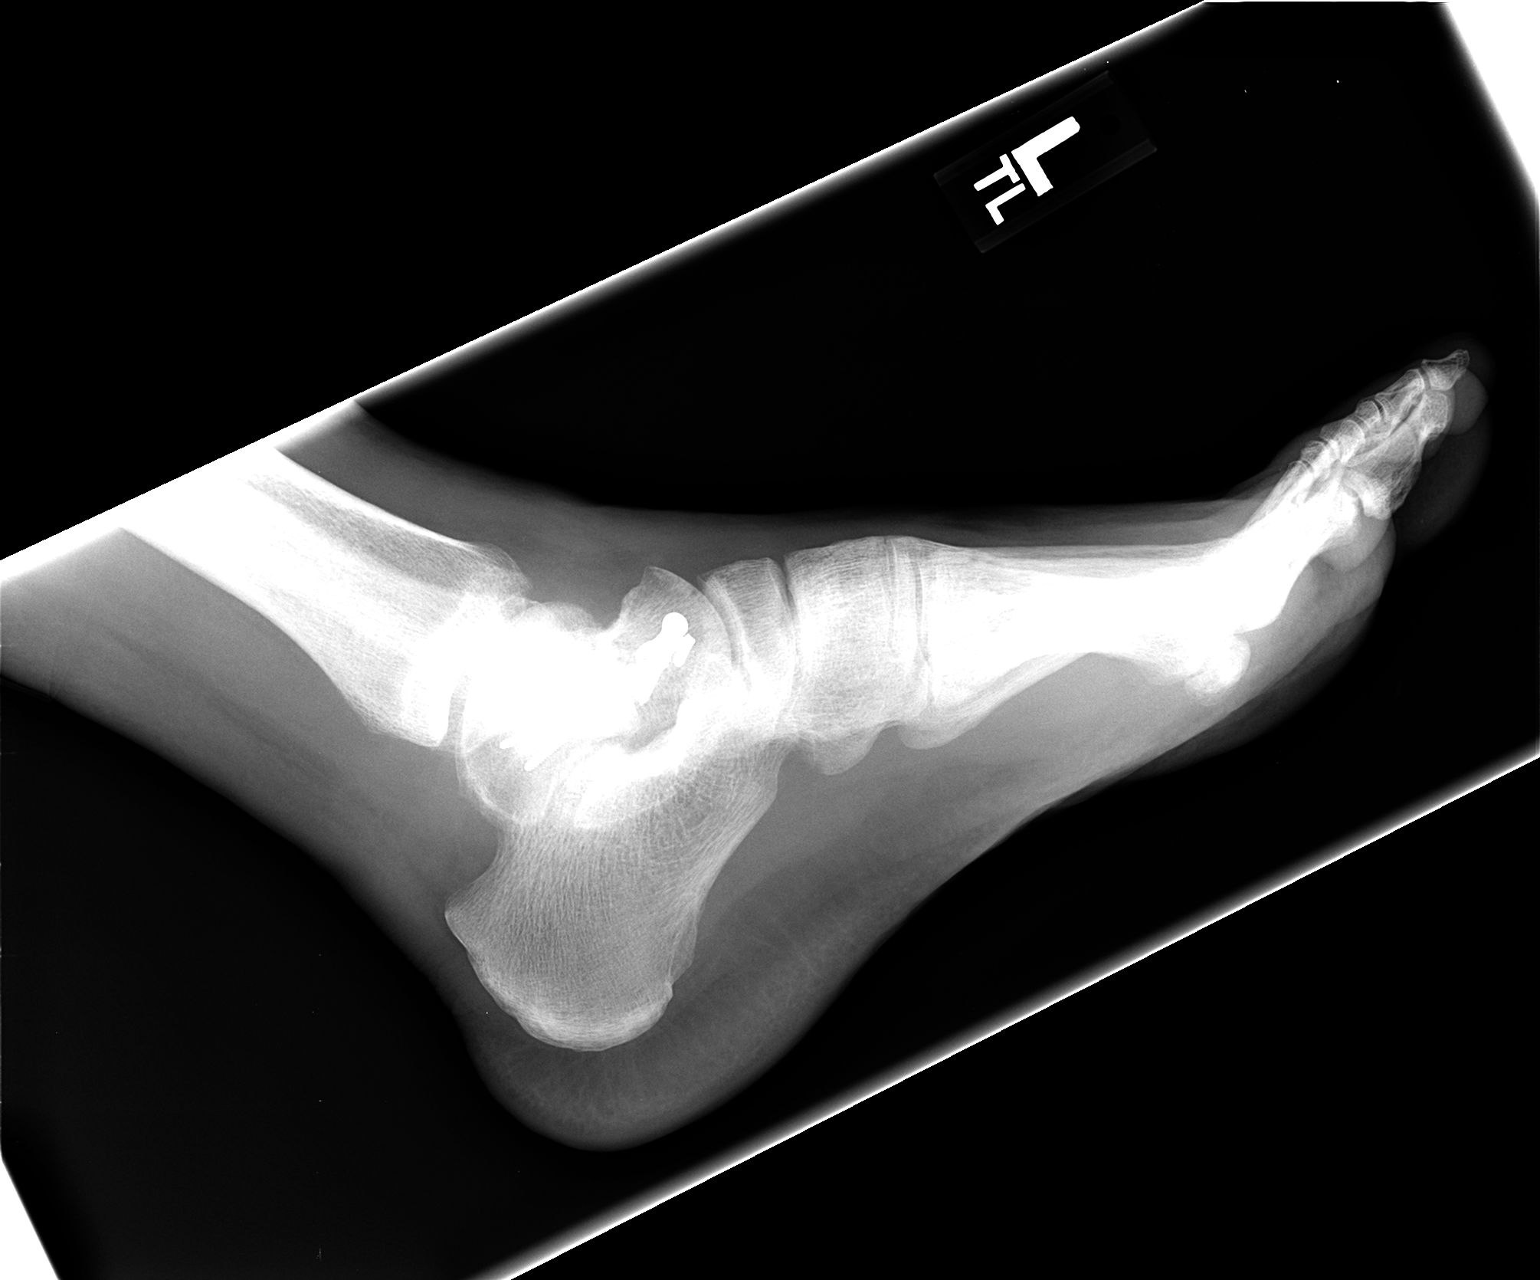

[3 of 3 positions shown; findings below may reference images not displayed]

FINDINGS: Plate and screw fixation of mid talar fracture is noted
with no change in alignment on the films obtained.  No other bony
abnormality is seen.  Tarsometatarsal alignment is normal.
IMPRESSION: Stable plate and screw fixation of mid talar fracture.

## 2007-09-26 ENCOUNTER — Encounter: Admission: RE | Admit: 2007-09-26 | Discharge: 2007-09-26 | Payer: Self-pay | Admitting: Orthopedic Surgery

## 2007-09-26 IMAGING — CR DG ANKLE 2V *L*
1 series · 1 of 1 positions shown · non-contrast
Comparison: [DATE]

CLINICAL DATA: Talus fracture

LEFT ANKLE - 2 VIEW

[view not recorded]
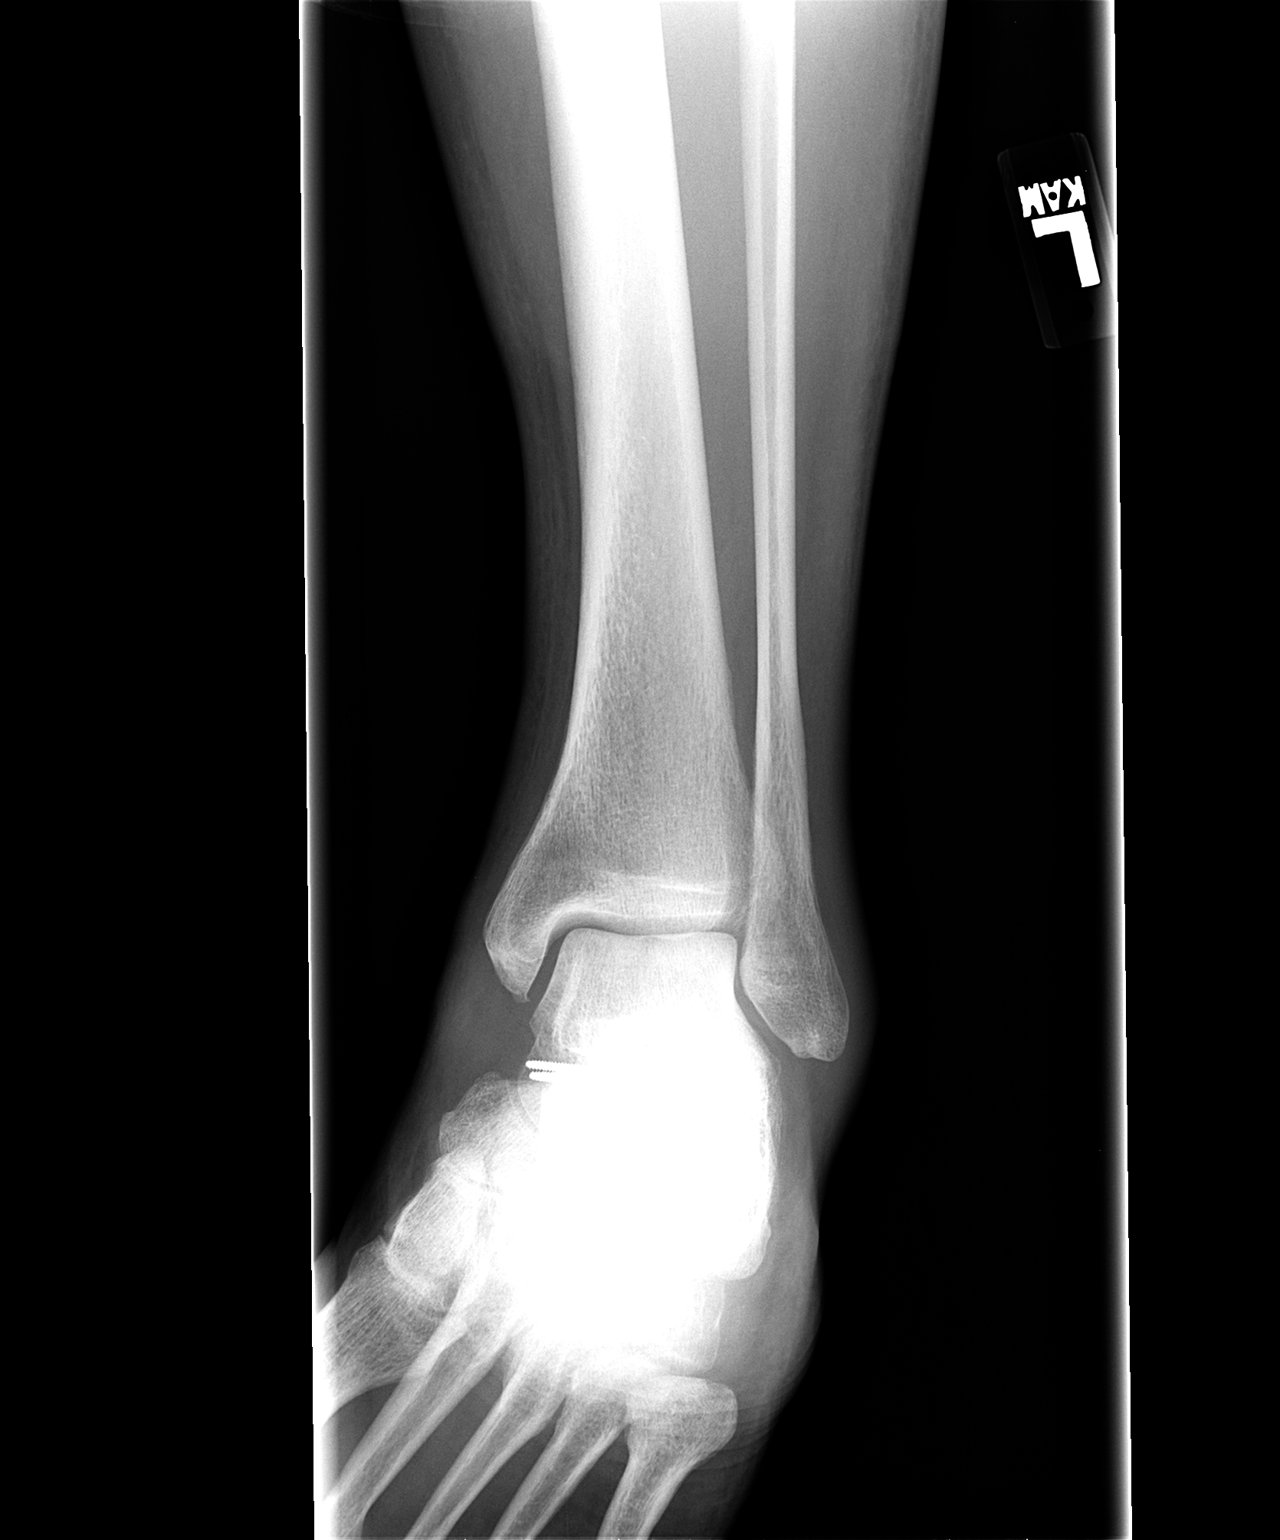

[1 of 1 positions shown; findings below may reference images not displayed]

FINDINGS: Plate screw fixation of the mid talar fracture again
noted.  Stable appearance.  No acute bony abnormality.
IMPRESSION: Stable postoperative changes and appearance of the mid talar
fracture.

## 2007-09-26 IMAGING — CR DG FOOT COMPLETE 3+V*L*
2 series · 2 of 2 positions shown · non-contrast
Comparison: [DATE]

CLINICAL DATA: Talus fracture

LEFT FOOT - COMPLETE 3+ VIEW

[view not recorded (1 of 2)]
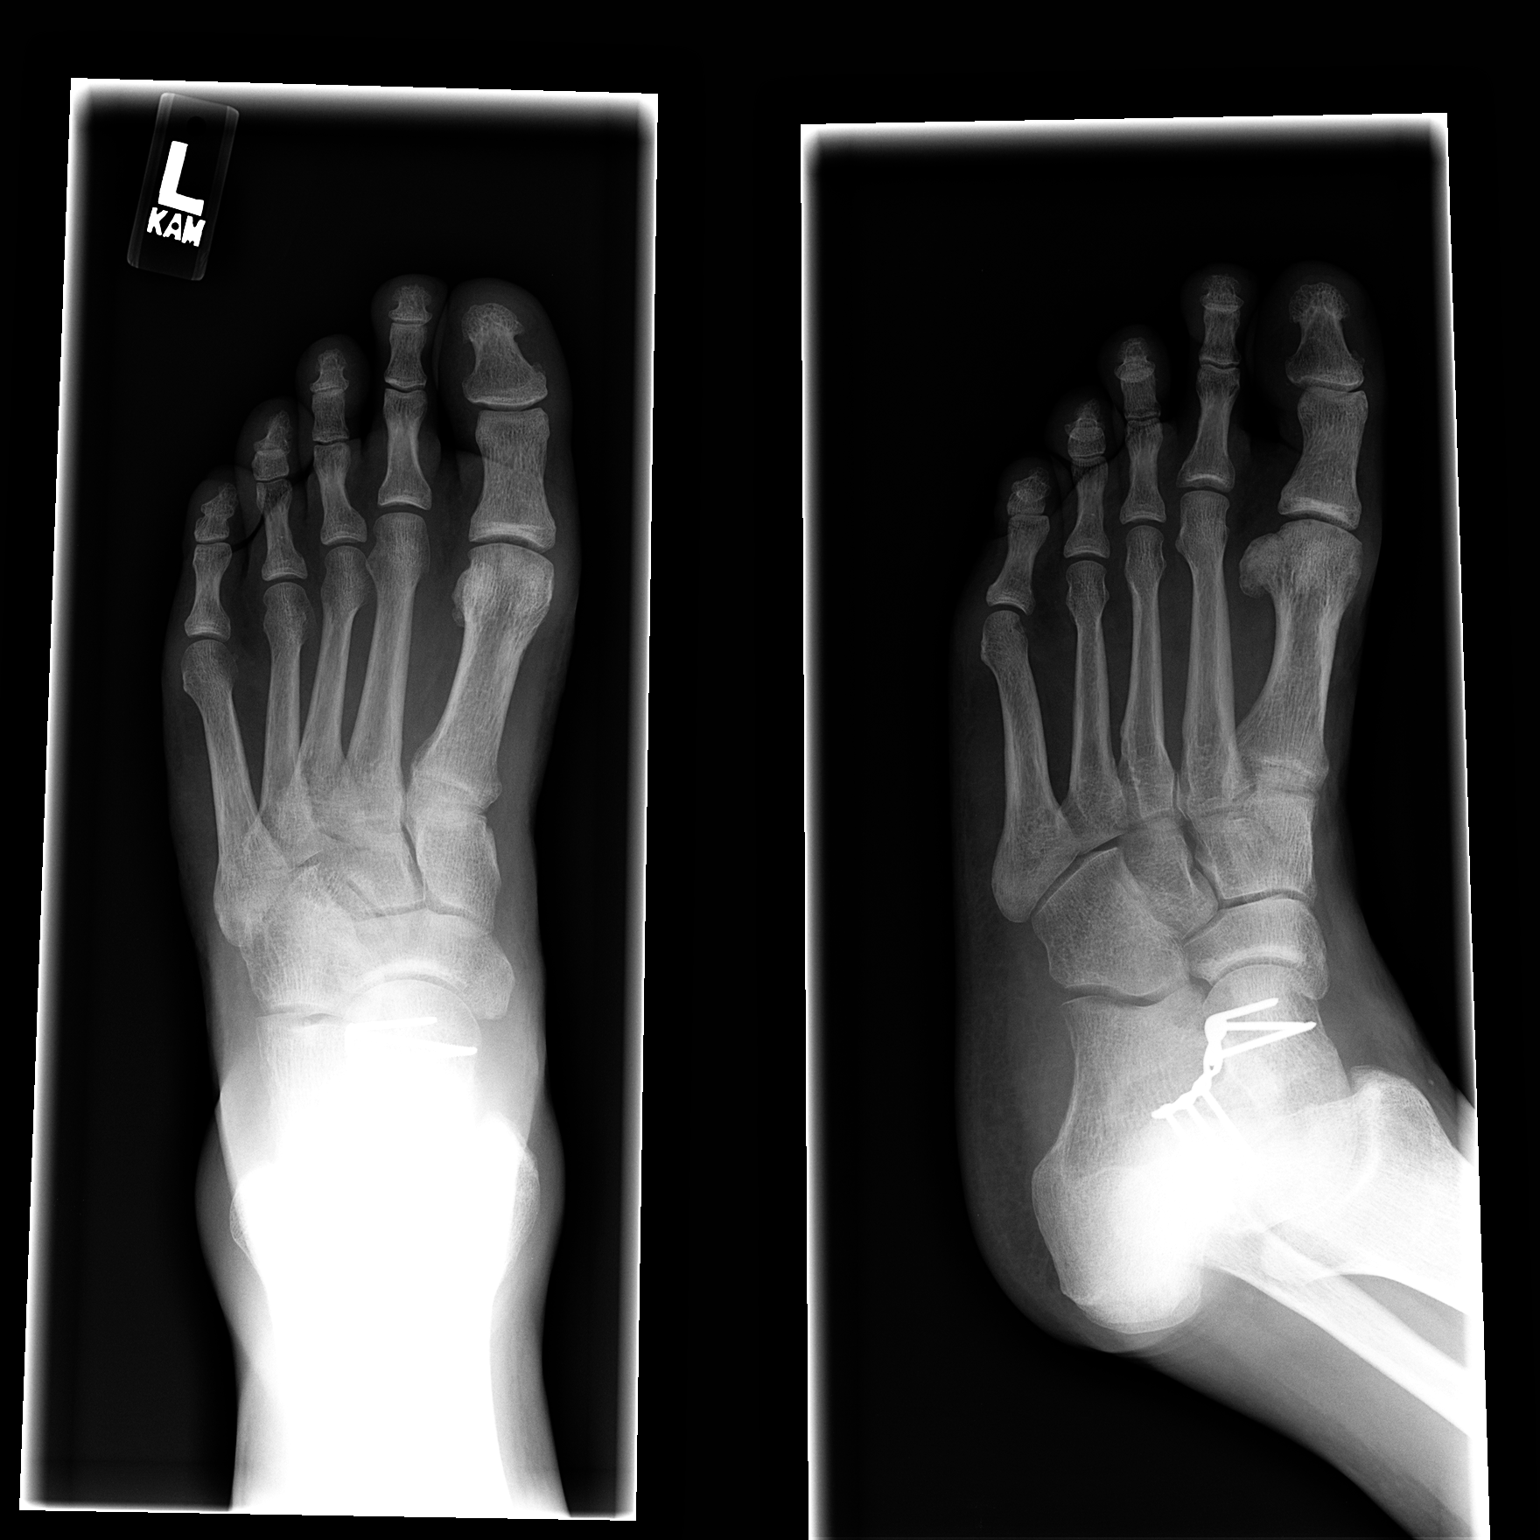

[view not recorded (2 of 2)]
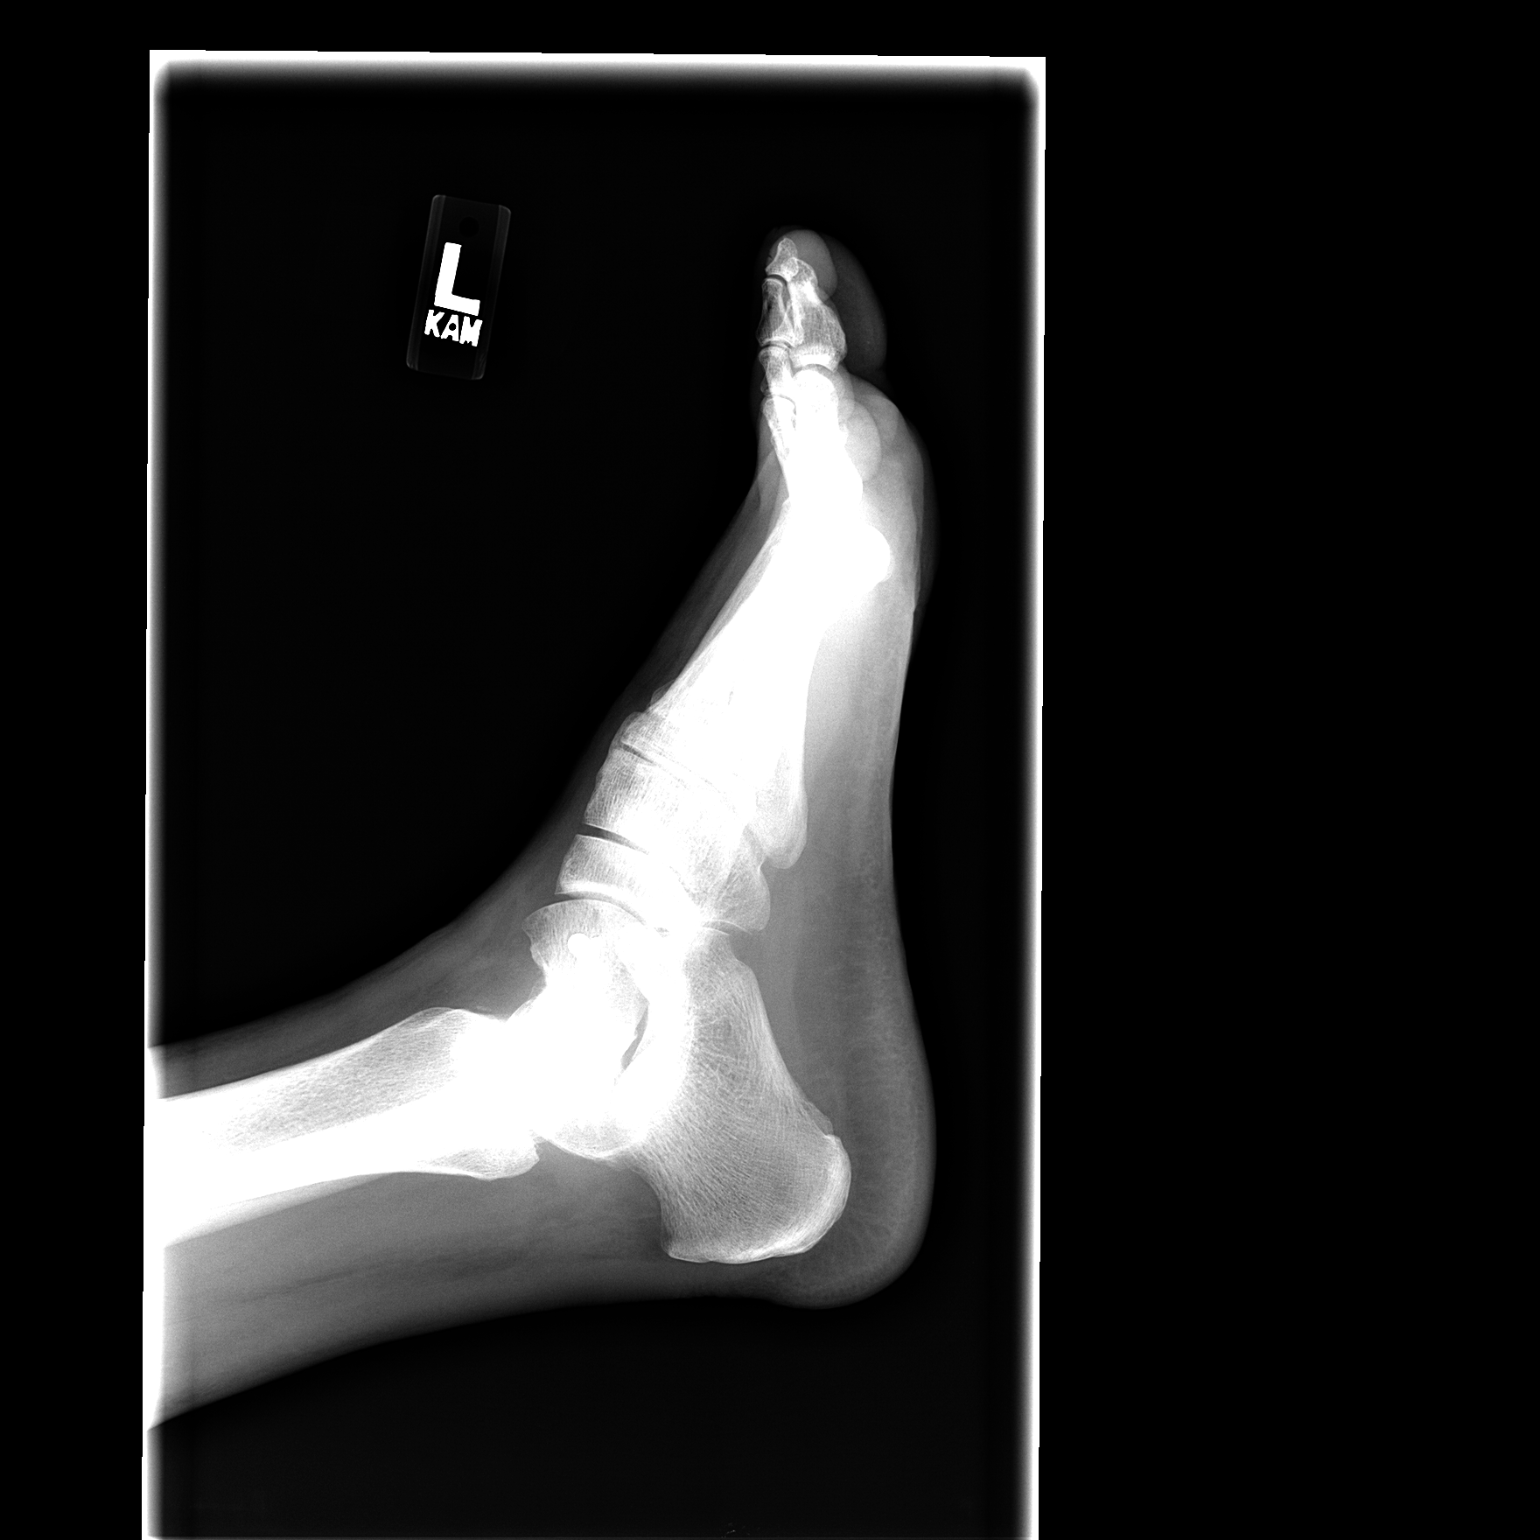

[2 of 2 positions shown; findings below may reference images not displayed]

FINDINGS: Stable appearance of the internal plate and screw
fixation of the mid talar fracture.  No acute bony abnormality or
change since prior study.
IMPRESSION: Stable postoperative appearance across the mid talar fracture.

## 2010-08-05 NOTE — Op Note (Signed)
NAME:  Adam Noble, Adam Noble                 ACCOUNT NO.:  [id number]   MEDICAL RECORD NO.:  [id number]          PATIENT TYPE:  OIB   LOCATION:  2550                         FACILITY:  [facility]   PHYSICIAN:  Doralee Albino. Carola Frost, M.D. DATE OF BIRTH:  05-Jun-1955   DATE OF PROCEDURE:  06/30/2007  DATE OF DISCHARGE:                               OPERATIVE REPORT   PREOPERATIVE DIAGNOSIS:  Left talar neck fracture.   POSTOPERATIVE DIAGNOSIS:  Left talar neck fracture.   PROCEDURE:  1. Open reduction and internal fixation of left talus.  2. Repair peroneal tendons sling and sheath.   SURGEON:  Doralee Albino. Carola Frost, M.D.   ASSISTANT:  Mearl Latin, P.A.-C.   ANESTHESIA:  General.   COMPLICATIONS:  None.   TOURNIQUET TIME:  None.   ESTIMATED BLOOD LOSS:  Less than 50 mL.   DISPOSITION:  To the PACU.   CONDITION:  Stable.   INDICATIONS FOR PROCEDURE:  Adam Noble is a very pleasant 55 year old  male who fell 6 feet off a ladder sustaining immediate onset pain in his  ankle.  He was seen by Drs. Supple and Lestine Box who felt that he required  ORIF and this should be done urgently.  Dr. Lestine Box is unavailable to  treat his fracture secondary to his previously scheduled cases.  He  requested that I assume management of the patient given my fellowship  training in orthopedic fracture care.  I discussed with the patient the  risks and benefits of surgery including the possibility of avascular  necrosis, infection, nerve injury, vessel injury, malunion, nonunion,  need for further surgery, arthritis, stiffness, DVT, PE and multiple  others.  After a full discussion, the patient did wish to proceed with  surgical fixation as did his wife.   DESCRIPTION OF PROCEDURE:  Adam Noble received preop antibiotics and was  taken to the operating room where his left lower extremity was prepped  and draped in the usual sterile fashion.  No tourniquet was used during  the procedure.  During evaluation prior to making  an incision, the  patient's peroneal tendons were noted to be anterior to his lateral  malleolus and could easily be completely dislocated in apposition and  then reduced with milking the tendons posteriorly.  This was diagnostic,  of course, of a complete rupture of his peroneal tendon sling.   We made a standard lateral approach which extended more posterior than  usual in order to repair his peroneal sling.  We were able to identify  the tendons and the disrupted sling which had torn mid substance rather  than from its insertion either on the fibula or more posterior to this.  We used a #2 FiberWire to repair this with two figure-of-eight sutures  and then later repaired the tendon sheath, as well.  We continued our  dissection along the sinus tarsi detaching the EDB and exposing the  lateral aspect of the talar neck.  There was extensive comminution as  well as displacement.  This was both cortical and cancellus comminution.  We were unable to gauge reduction on the  lateral side as anticipated.  Consequently, we made a medial incision and were very meticulous and  careful to avoid any injury to the medial blood supply and its insertion  into the medial malleolus. Our dissection was limited just to the area  of the fracture.  We did see the saphenous vein and gently retracted  that medially or plantarly while we incised the periosteum just along  the fracture site.  This was very helpful in allowing [place] to gauge and  reduce the fracture. We de-rotated the talar head which was in some  varus and dorsiflexion. We fixed it provisionally with several K-wires  and then followed this with placement of a Blade plate put in through  the lateral side. We were careful to place three screws into the head  fragment, three screws in the body, and avoided the joint space.  We did  confirm this on multiple fluoro images and obtained excellent purchase  in the bone with good compression at the fracture  site.  We removed the  K-wires were able to obtain excellent and full range of motion of the  ankle and subtalar joint. Furthermore, we could visualize the subtalar  joint and see there was no debris or major chondral injury present.   We irrigated thoroughly and closed in standard layered fashion using 0  Vicryl for EDP repair, 2-0 Vicryl for the deep subcu, 3-0 Vicryl for the  shallow subcu, and 4-0 nylon for the skin.  A sterile gently compressive  dressing was applied and a posterior stirrup splint.  The patient was  awakened from anesthesia and transferred to the PACU in stable  condition.   PROGNOSIS:  Adam Noble remains at some risk but the odds are in his  favor as this appears to be a Hawkins 2 type fracture. We will plan to  see him back in two weeks for removal of his splint and initiation of  flexion extension of the ankle.  At that time, we anticipate passive  range of subtalar motion rather than active to avoid stressing the  repair of his peroneal tendon sheath and sling.  He will be on DVT  prophylaxis with Lovenox while in the hospital and discharged either on  a ten day course or aspirin after discussion of the risks and benefits  with the patient.      Doralee Albino. Carola Frost, M.D.  Electronically Signed     M[facility]/MEDQ  D:  06/30/2007  T:  06/30/2007  Job:  [id number]

## 2010-12-16 LAB — URINE CULTURE: Culture: NO GROWTH

## 2010-12-16 LAB — URINALYSIS, ROUTINE W REFLEX MICROSCOPIC
Bilirubin Urine: NEGATIVE
Nitrite: NEGATIVE
Specific Gravity, Urine: 1.027
Urobilinogen, UA: 0.2

## 2010-12-16 LAB — COMPREHENSIVE METABOLIC PANEL
Alkaline Phosphatase: 29 — ABNORMAL LOW
BUN: 15
Calcium: 9.1
Creatinine, Ser: 1.17
Glucose, Bld: 87
Total Protein: 6.7

## 2010-12-16 LAB — CBC
HCT: 42.5
Hemoglobin: 11.6 — ABNORMAL LOW
Hemoglobin: 14.1
MCHC: 33.3
MCV: 82.3
Platelets: 222
RBC: 4.19 — ABNORMAL LOW
RDW: 14.8
WBC: 8.2

## 2010-12-16 LAB — DIFFERENTIAL
Basophils Relative: 0
Lymphocytes Relative: 17
Lymphs Abs: 1.3
Monocytes Relative: 8
Neutro Abs: 5.6
Neutrophils Relative %: 73

## 2010-12-16 LAB — APTT: aPTT: 28

## 2010-12-16 LAB — BASIC METABOLIC PANEL
BUN: 12
Calcium: 8.6
Chloride: 100
Creatinine, Ser: 1.29
GFR calc non Af Amer: 59 — ABNORMAL LOW
Glucose, Bld: 129 — ABNORMAL HIGH
Potassium: 3.7

## 2010-12-16 LAB — PROTIME-INR: INR: 0.9

## 2010-12-16 LAB — TYPE AND SCREEN

## 2010-12-16 LAB — URINE MICROSCOPIC-ADD ON

## 2012-11-01 ENCOUNTER — Other Ambulatory Visit: Payer: Self-pay

## 2012-11-01 ENCOUNTER — Encounter (HOSPITAL_BASED_OUTPATIENT_CLINIC_OR_DEPARTMENT_OTHER): Payer: Self-pay | Admitting: *Deleted

## 2012-11-01 ENCOUNTER — Encounter (HOSPITAL_BASED_OUTPATIENT_CLINIC_OR_DEPARTMENT_OTHER)
Admission: RE | Admit: 2012-11-01 | Discharge: 2012-11-01 | Disposition: A | Discharge status: Home / Self Care | Payer: BC Managed Care – PPO | Source: Ambulatory Visit | Attending: Orthopedic Surgery | Admitting: Orthopedic Surgery

## 2012-11-01 DIAGNOSIS — Z01810 Encounter for preprocedural cardiovascular examination: Secondary | ICD-10-CM | POA: Insufficient documentation

## 2012-11-01 NOTE — Progress Notes (Signed)
Will come in for ekg-did have labs 10/06/12 To bring crutches

## 2012-11-02 ENCOUNTER — Other Ambulatory Visit: Payer: Self-pay | Admitting: Orthopedic Surgery

## 2012-11-03 ENCOUNTER — Ambulatory Visit (HOSPITAL_BASED_OUTPATIENT_CLINIC_OR_DEPARTMENT_OTHER)
Admission: RE | Admit: 2012-11-03 | Discharge: 2012-11-03 | Disposition: A | Discharge status: Home / Self Care | Payer: BC Managed Care – PPO | Source: Ambulatory Visit | Attending: Orthopedic Surgery | Admitting: Orthopedic Surgery

## 2012-11-03 ENCOUNTER — Ambulatory Visit: Admit: 2012-11-03 | Payer: Self-pay | Admitting: Orthopedic Surgery

## 2012-11-03 ENCOUNTER — Ambulatory Visit (HOSPITAL_BASED_OUTPATIENT_CLINIC_OR_DEPARTMENT_OTHER): Payer: BC Managed Care – PPO | Admitting: Certified Registered Nurse Anesthetist

## 2012-11-03 ENCOUNTER — Encounter (HOSPITAL_BASED_OUTPATIENT_CLINIC_OR_DEPARTMENT_OTHER): Payer: Self-pay | Admitting: Certified Registered Nurse Anesthetist

## 2012-11-03 ENCOUNTER — Encounter (HOSPITAL_BASED_OUTPATIENT_CLINIC_OR_DEPARTMENT_OTHER)
Admission: RE | Disposition: A | Discharge status: Home / Self Care | Payer: Self-pay | Source: Ambulatory Visit | Attending: Orthopedic Surgery

## 2012-11-03 DIAGNOSIS — Z79899 Other long term (current) drug therapy: Secondary | ICD-10-CM | POA: Insufficient documentation

## 2012-11-03 DIAGNOSIS — Z01810 Encounter for preprocedural cardiovascular examination: Secondary | ICD-10-CM | POA: Insufficient documentation

## 2012-11-03 DIAGNOSIS — E785 Hyperlipidemia, unspecified: Secondary | ICD-10-CM | POA: Insufficient documentation

## 2012-11-03 DIAGNOSIS — Y929 Unspecified place or not applicable: Secondary | ICD-10-CM | POA: Insufficient documentation

## 2012-11-03 DIAGNOSIS — S86011S Strain of right Achilles tendon, sequela: Secondary | ICD-10-CM

## 2012-11-03 DIAGNOSIS — I10 Essential (primary) hypertension: Secondary | ICD-10-CM | POA: Insufficient documentation

## 2012-11-03 DIAGNOSIS — S93499A Sprain of other ligament of unspecified ankle, initial encounter: Secondary | ICD-10-CM | POA: Insufficient documentation

## 2012-11-03 DIAGNOSIS — N400 Enlarged prostate without lower urinary tract symptoms: Secondary | ICD-10-CM | POA: Insufficient documentation

## 2012-11-03 DIAGNOSIS — Y9302 Activity, running: Secondary | ICD-10-CM | POA: Insufficient documentation

## 2012-11-03 DIAGNOSIS — M6240 Contracture of muscle, unspecified site: Secondary | ICD-10-CM | POA: Insufficient documentation

## 2012-11-03 DIAGNOSIS — X58XXXA Exposure to other specified factors, initial encounter: Secondary | ICD-10-CM | POA: Insufficient documentation

## 2012-11-03 DIAGNOSIS — Z7982 Long term (current) use of aspirin: Secondary | ICD-10-CM | POA: Insufficient documentation

## 2012-11-03 HISTORY — PX: ACHILLES TENDON SURGERY: SHX542

## 2012-11-03 HISTORY — PX: TENDON TRANSFER: SHX6109

## 2012-11-03 HISTORY — PX: GASTROCNEMIUS RECESSION: SHX863

## 2012-11-03 HISTORY — DX: Essential (primary) hypertension: I10

## 2012-11-03 HISTORY — DX: Hyperlipidemia, unspecified: E78.5

## 2012-11-03 HISTORY — DX: Benign prostatic hyperplasia without lower urinary tract symptoms: N40.0

## 2012-11-03 HISTORY — DX: Encounter for fitting and adjustment of spectacles and contact lenses: Z46.0

## 2012-11-03 LAB — POCT I-STAT, CHEM 8
HCT: 44 % (ref 39.0–52.0)
Hemoglobin: 15 g/dL (ref 13.0–17.0)
Potassium: 3.9 mEq/L (ref 3.5–5.1)
Sodium: 141 mEq/L (ref 135–145)
TCO2: 23 mmol/L (ref 0–100)

## 2012-11-03 SURGERY — REPAIR, TENDON, ACHILLES
Anesthesia: Regional | Laterality: Right

## 2012-11-03 SURGERY — REPAIR, TENDON, ACHILLES
Anesthesia: Regional | Site: Leg Lower | Laterality: Right | Wound class: Clean

## 2012-11-03 MED ORDER — SUCCINYLCHOLINE CHLORIDE 20 MG/ML IJ SOLN
INTRAMUSCULAR | Status: DC | PRN
  Administered 2012-11-03: 100 mg via INTRAVENOUS

## 2012-11-03 MED ORDER — 0.9 % SODIUM CHLORIDE (POUR BTL) OPTIME
TOPICAL | Status: DC | PRN
  Administered 2012-11-03: 400 mL

## 2012-11-03 MED ORDER — SODIUM CHLORIDE 0.9 % IV SOLN: INTRAVENOUS | Status: DC

## 2012-11-03 MED ORDER — BACITRACIN ZINC 500 UNIT/GM EX OINT
TOPICAL_OINTMENT | CUTANEOUS | Status: DC | PRN
  Administered 2012-11-03: 1 via TOPICAL

## 2012-11-03 MED ORDER — LIDOCAINE HCL (CARDIAC) 20 MG/ML IV SOLN
INTRAVENOUS | Status: DC | PRN
  Administered 2012-11-03: 30 mg via INTRAVENOUS

## 2012-11-03 MED ORDER — BUPIVACAINE HCL (PF) 0.5 % IJ SOLN
INTRAMUSCULAR | Status: DC | PRN
  Administered 2012-11-03: 10 mL

## 2012-11-03 MED ORDER — DEXAMETHASONE SODIUM PHOSPHATE 4 MG/ML IJ SOLN
INTRAMUSCULAR | Status: DC | PRN
  Administered 2012-11-03: 10 mg via INTRAVENOUS

## 2012-11-03 MED ORDER — OXYCODONE HCL 5 MG/5ML PO SOLN: 5.0000 mg | Freq: Once | ORAL | Status: DC | PRN

## 2012-11-03 MED ORDER — PROPOFOL 10 MG/ML IV BOLUS
INTRAVENOUS | Status: DC | PRN
  Administered 2012-11-03: 200 mg via INTRAVENOUS

## 2012-11-03 MED ORDER — ASPIRIN EC 325 MG PO TBEC: 325.0000 mg | DELAYED_RELEASE_TABLET | Freq: Every day | ORAL | Status: DC

## 2012-11-03 MED ORDER — FENTANYL CITRATE 0.05 MG/ML IJ SOLN
50.0000 ug | INTRAMUSCULAR | Status: DC | PRN
  Administered 2012-11-03: 100 ug via INTRAVENOUS

## 2012-11-03 MED ORDER — FENTANYL CITRATE 0.05 MG/ML IJ SOLN
INTRAMUSCULAR | Status: DC | PRN
  Administered 2012-11-03 (×2): 25 ug via INTRAVENOUS
  Administered 2012-11-03: 50 ug via INTRAVENOUS

## 2012-11-03 MED ORDER — CEFAZOLIN SODIUM-DEXTROSE 2-3 GM-% IV SOLR
2.0000 g | INTRAVENOUS | Status: AC
  Administered 2012-11-03: 1 g via INTRAVENOUS
  Administered 2012-11-03: 2 g via INTRAVENOUS

## 2012-11-03 MED ORDER — CHLORHEXIDINE GLUCONATE 4 % EX LIQD: 60.0000 mL | Freq: Once | CUTANEOUS | Status: DC

## 2012-11-03 MED ORDER — OXYCODONE HCL 5 MG PO TABS: 5.0000 mg | ORAL_TABLET | Freq: Once | ORAL | Status: DC | PRN

## 2012-11-03 MED ORDER — HYDROMORPHONE HCL PF 1 MG/ML IJ SOLN: 0.2500 mg | INTRAMUSCULAR | Status: DC | PRN

## 2012-11-03 MED ORDER — MIDAZOLAM HCL 5 MG/5ML IJ SOLN
INTRAMUSCULAR | Status: DC | PRN
  Administered 2012-11-03: 1 mg via INTRAVENOUS

## 2012-11-03 MED ORDER — BUPIVACAINE-EPINEPHRINE PF 0.5-1:200000 % IJ SOLN
INTRAMUSCULAR | Status: DC | PRN
  Administered 2012-11-03: 30 mL

## 2012-11-03 MED ORDER — LACTATED RINGERS IV SOLN
INTRAVENOUS | Status: DC
  Administered 2012-11-03 (×3): via INTRAVENOUS

## 2012-11-03 MED ORDER — MIDAZOLAM HCL 2 MG/2ML IJ SOLN
1.0000 mg | INTRAMUSCULAR | Status: DC | PRN
  Administered 2012-11-03: 2 mg via INTRAVENOUS

## 2012-11-03 MED ORDER — OXYCODONE HCL 5 MG PO TABS: 5.0000 mg | ORAL_TABLET | ORAL | Status: DC | PRN

## 2012-11-03 SURGICAL SUPPLY — 88 items
BAG DECANTER FOR FLEXI CONT (MISCELLANEOUS) IMPLANT
BANDAGE CONFORM 2  STR LF (GAUZE/BANDAGES/DRESSINGS) IMPLANT
BANDAGE ESMARK 6X9 LF (GAUZE/BANDAGES/DRESSINGS) ×2 IMPLANT
BLADE AVERAGE 25X9 (BLADE) IMPLANT
BLADE OSC/SAG .038X5.5 CUT EDG (BLADE) IMPLANT
BLADE SURG 15 STRL LF DISP TIS (BLADE) ×12 IMPLANT
BLADE SURG 15 STRL SS (BLADE) ×6
BNDG COHESIVE 4X5 TAN STRL (GAUZE/BANDAGES/DRESSINGS) ×3 IMPLANT
BNDG COHESIVE 6X5 TAN STRL LF (GAUZE/BANDAGES/DRESSINGS) ×3 IMPLANT
BNDG ESMARK 6X9 LF (GAUZE/BANDAGES/DRESSINGS) ×3
CANISTER SUCTION 1200CC (MISCELLANEOUS) ×3 IMPLANT
CHLORAPREP W/TINT 26ML (MISCELLANEOUS) ×3 IMPLANT
CLOTH BEACON ORANGE TIMEOUT ST (SAFETY) ×3 IMPLANT
COVER TABLE BACK 60X90 (DRAPES) ×3 IMPLANT
CUFF TOURNIQUET SINGLE 34IN LL (TOURNIQUET CUFF) ×3 IMPLANT
DECANTER SPIKE VIAL GLASS SM (MISCELLANEOUS) IMPLANT
DRAPE EXTREMITY T 121X128X90 (DRAPE) ×3 IMPLANT
DRAPE OEC MINIVIEW 54X84 (DRAPES) ×3 IMPLANT
DRAPE U-SHAPE 47X51 STRL (DRAPES) ×3 IMPLANT
DRSG EMULSION OIL 3X3 NADH (GAUZE/BANDAGES/DRESSINGS) ×6 IMPLANT
DRSG PAD ABDOMINAL 8X10 ST (GAUZE/BANDAGES/DRESSINGS) ×6 IMPLANT
DURA STEPPER LG (CAST SUPPLIES) IMPLANT
DURA STEPPER MED (CAST SUPPLIES) IMPLANT
ELECT REM PT RETURN 9FT ADLT (ELECTROSURGICAL) ×3
ELECTRODE REM PT RTRN 9FT ADLT (ELECTROSURGICAL) ×2 IMPLANT
GAUZE SPONGE 4X4 16PLY XRAY LF (GAUZE/BANDAGES/DRESSINGS) ×3 IMPLANT
GLOVE BIO SURGEON STRL SZ8 (GLOVE) ×6 IMPLANT
GLOVE BIOGEL PI IND STRL 7.0 (GLOVE) ×4 IMPLANT
GLOVE BIOGEL PI IND STRL 8 (GLOVE) ×2 IMPLANT
GLOVE BIOGEL PI INDICATOR 7.0 (GLOVE) ×2
GLOVE BIOGEL PI INDICATOR 8 (GLOVE) ×1
GLOVE ECLIPSE 6.5 STRL STRAW (GLOVE) ×3 IMPLANT
GLOVE EXAM NITRILE MD LF STRL (GLOVE) ×3 IMPLANT
GOWN PREVENTION PLUS XLARGE (GOWN DISPOSABLE) ×3 IMPLANT
GOWN PREVENTION PLUS XXLARGE (GOWN DISPOSABLE) ×6 IMPLANT
GRAFT JACKET PERIOST 5CMX5CM (Orthopedic Implant) ×3 IMPLANT
KIT BIO-TENODESIS 3X8 DISP (MISCELLANEOUS) ×1
KIT INSRT BABSR STRL DISP BTN (MISCELLANEOUS) ×2 IMPLANT
NDL SAFETY ECLIPSE 18X1.5 (NEEDLE) IMPLANT
NDL SUT 6 .5 CRC .975X.05 MAYO (NEEDLE) IMPLANT
NEEDLE HYPO 18GX1.5 SHARP (NEEDLE)
NEEDLE HYPO 22GX1.5 SAFETY (NEEDLE) IMPLANT
NEEDLE HYPO 25X1 1.5 SAFETY (NEEDLE) IMPLANT
NEEDLE MAYO TAPER (NEEDLE)
NS IRRIG 1000ML POUR BTL (IV SOLUTION) ×3 IMPLANT
PACK BASIN DAY SURGERY FS (CUSTOM PROCEDURE TRAY) ×3 IMPLANT
PAD CAST 4YDX4 CTTN HI CHSV (CAST SUPPLIES) ×4 IMPLANT
PADDING CAST ABS 4INX4YD NS (CAST SUPPLIES)
PADDING CAST ABS COTTON 4X4 ST (CAST SUPPLIES) IMPLANT
PADDING CAST COTTON 4X4 STRL (CAST SUPPLIES) ×2
PADDING CAST COTTON 6X4 STRL (CAST SUPPLIES) ×3 IMPLANT
PASSER SUT SWANSON 36MM LOOP (INSTRUMENTS) IMPLANT
PENCIL BUTTON HOLSTER BLD 10FT (ELECTRODE) ×3 IMPLANT
SANITIZER HAND PURELL 535ML FO (MISCELLANEOUS) ×3 IMPLANT
SCREW PEEK TENODESIS 7X23M (Screw) ×3 IMPLANT
SCREW TENODESIS BIOCOMP 7MM (Screw) ×3 IMPLANT
SHEET MEDIUM DRAPE 40X70 STRL (DRAPES) ×3 IMPLANT
SLEEVE SCD COMPRESS KNEE MED (MISCELLANEOUS) ×3 IMPLANT
SPLINT FAST PLASTER 5X30 (CAST SUPPLIES) ×20
SPLINT PLASTER CAST FAST 5X30 (CAST SUPPLIES) ×40 IMPLANT
SPONGE GAUZE 4X4 12PLY (GAUZE/BANDAGES/DRESSINGS) ×6 IMPLANT
SPONGE LAP 18X18 X RAY DECT (DISPOSABLE) ×3 IMPLANT
STAPLER VISISTAT 35W (STAPLE) IMPLANT
STOCKINETTE 6  STRL (DRAPES) ×1
STOCKINETTE 6 STRL (DRAPES) ×2 IMPLANT
STRIP CLOSURE SKIN 1/2X4 (GAUZE/BANDAGES/DRESSINGS) IMPLANT
SUCTION FRAZIER TIP 10 FR DISP (SUCTIONS) IMPLANT
SUT 2 FIBERLOOP 20 STRT BLUE (SUTURE)
SUT ETHILON 3 0 PS 1 (SUTURE) ×6 IMPLANT
SUT FIBERWIRE #2 38 T-5 BLUE (SUTURE) ×6
SUT MERSILENE 2.0 SH NDLE (SUTURE) IMPLANT
SUT MNCRL AB 3-0 PS2 18 (SUTURE) ×9 IMPLANT
SUT MNCRL AB 4-0 PS2 18 (SUTURE) ×3 IMPLANT
SUT VIC AB 0 CT3 27 (SUTURE) ×3 IMPLANT
SUT VIC AB 0 SH 27 (SUTURE) ×12 IMPLANT
SUT VIC AB 2-0 SH 18 (SUTURE) ×3 IMPLANT
SUT VIC AB 2-0 SH 27 (SUTURE)
SUT VIC AB 2-0 SH 27XBRD (SUTURE) IMPLANT
SUT VICRYL 4-0 PS2 18IN ABS (SUTURE) IMPLANT
SUTURE 2 FIBERLOOP 20 STRT BLU (SUTURE) IMPLANT
SUTURE FIBERWR #2 38 T-5 BLUE (SUTURE) ×4 IMPLANT
SYR BULB 3OZ (MISCELLANEOUS) ×3 IMPLANT
SYR CONTROL 10ML LL (SYRINGE) IMPLANT
TOWEL OR 17X24 6PK STRL BLUE (TOWEL DISPOSABLE) ×3 IMPLANT
TOWEL OR NON WOVEN STRL DISP B (DISPOSABLE) ×3 IMPLANT
TUBE CONNECTING 20X1/4 (TUBING) ×3 IMPLANT
UNDERPAD 30X30 INCONTINENT (UNDERPADS AND DIAPERS) ×3 IMPLANT
YANKAUER SUCT BULB TIP NO VENT (SUCTIONS) ×3 IMPLANT

## 2012-11-03 NOTE — Op Note (Signed)
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  PROCEDURE: 1. Right gastrocnemius recession. 2. Right Achilles tendon debridement and reconstruction with plantaris     autograft and GraftJacket xenograft augmentation 3. Transfer of flexor hallucis longus tendon to the calcaneus.  SURGEON:  Toni Arthurs, MD  ANESTHESIA:  General, regional.  ESTIMATED BLOOD LOSS:  Minimal.  TOURNIQUET TIME:  112 minutes at 250 mmHg.  COMPLICATIONS:  None apparent.  DISPOSITION:  Extubated, awake, and stable to recovery.  INDICATION FOR PROCEDURE:  The patient is a 57 year old male with past medical history significant for Achilles tendon rupture several years ago.  He is treated in closed fashion.  He tried to resume running this past spring and again felt sensation of a tear.  He has given this several months of additional time and continues to have pain.  MRI shows a chronic rupture of the Achilles.  He also has a tight heel cord.  He presents now for operative treatment of this condition.  He understands the risks and benefits, the alternative treatment options and elects surgical treatment.  He specifically understands risks of bleeding, infection, nerve damage, blood clots, need for additional surgery, amputation, and death.  PROCEDURE IN DETAIL:  After preoperative consent was obtained and the correct operative site was identified, the patient was brought to the operating room supine on a gurney.  General anesthesia was induced. Preoperative antibiotics were  administered.  Surgical time-out was taken.  The right lower extremity was exsanguinated and thigh tourniquet was inflated to 250 mmHg.  The patient was then turned into the prone position on the operating table.  The right lower extremity was prepped and draped in standard sterile fashion.  A longitudinal incision was made at the level of the calf in the midline.  Sharp dissection was carried down through skin and subcutaneous tissue.  Care was taken to protect the sural nerve and lesser saphenous vein.  Superficial fascia was incised.  The gastrocnemius tendon was identified.  It was transected in its entirety under direct vision.  Attention was then turned to the posterior aspect of the leg where the palpable swelling was identified at the Achilles.  A longitudinal incision was made and sharp dissection was carried down through the skin and subcutaneous tissue and paratenon.  Full-thickness flaps were developed.  The Achilles tendon was noted to be intensely scarred and thickened to at least twice its normal diameter.  The scarred tissue was all sharply excised.  This left a defect of approximately 5 cm.  The plantaris tendon was released from its origin proximally and pulled out through the distal incision to use as an autograft.  The plantaris was left attached to its insertion distally.  The flexor hallucis tendon and muscle belly were identified.  The FHL was followed down into the tarsal tunnel.  The ankle and hallux were maximally plantar flexed and the FHL tendon was transected.  An Arthrex zip loop was then placed over the end of the tendon.  The tendon was measured at 6 mm.  A Beath pin was then driven in the calcaneus just anterior to the insertion of the Achilles on the calcaneal tuberosity.  It was passed out the plantar surface of the foot.  A 7 mm tunnel was then drilled over the Beath pin.  The FHL tendon was pulled down into the socket and the sutures were pulled  out through the sole of foot.  A 7 mm x 23 mm Arthrex Bio-Tenodesis Screw was inserted into the socket.  The screw did not gain good purchase. The FHL tendon pulled back out.  The screw was not identified within the tunnel or within the subcutaneous tissues around the calcaneus were plantar to the calcaneus.  Lateral and Harris heel views were obtained showing no evidence of any foreign material within the subcutaneous tissues.  The screw was felt to be lodged within the calcaneus.  The FHL was then pulled back down into the tunnel and tightened appropriately. A second screw was inserted and was noted to gain appropriate purchase securing the FHL to the calcaneus.  At this point, an attempt was made to approximate the ends of the tendon.  The gap was too large and this was unsuccessful.  The decision was made to proceed with a plantaris autograft with augmentation with xenograft.  The plantaris tendon was then woven through the end of the Achilles and sewn into place with 0 Vicryl simple sutures.  The plantaris graft was passed across the gap 5 times.  The graft jacket was then circumferentially wrapped around the repair and sewn in proximally and distally with simple sutures of 0 Vicryl.  The wound was irrigated copiously.  When the incision had been open 3 hours, a gram of IV cefazolin was administered.  At this point, subcutaneous tissue on the proximal incision was closed with inverted simple sutures of 3-0 Monocryl and a running 3-0 nylon was used to close the skin incision.  Distally, the peritenon was approximated back over the tendon repair with inverted simple sutures of 3-0 Monocryl. Subcutaneous tissue was approximated with inverted simple sutures of 3-0 Monocryl and a running 3-0 nylon was used to close the skin incision. The patient's Janee Morn test was normal at this point.  Sterile dressings were applied.  The tourniquet had been released at 1 hour and 52 minutes.   Hemostasis was achieved prior to closure.  Sterile dressings were applied followed by well-padded short-leg splint with the ankle in gravity equinus.  The patient was then awakened from anesthesia and transported to recovery room in stable condition.  FOLLOWUP PLAN:  The patient will be nonweightbearing on the right lower extremity.  He will start aspirin daily for DVT prophylaxis and will follow up with me in 2 weeks for suture removal.     Toni Arthurs, MD     J[facility]/MEDQ  D:  11/03/2012  T:  11/03/2012  Job:  [id number]

## 2012-11-03 NOTE — Progress Notes (Signed)
Assisted Dr. Fitzgerald with right, ultrasound guided, popliteal/saphenous block. Side rails up, monitors on throughout procedure. See vital signs in flow sheet. Tolerated Procedure well. 

## 2012-11-03 NOTE — Brief Op Note (Signed)
11/03/2012  11:19 AM  PATIENT:  Bebe Shaggy  57 y.o. male  PRE-OPERATIVE DIAGNOSIS:  Right Achilles Tendon Chronic Tear and Tight Heel Cord  POST-OPERATIVE DIAGNOSIS:  Right Achilles Tendon Chronic Tear and Tight Heel Cord  Procedure(s): 1.  Right gastrocnemius recession 2.  Right achilles tendon debridement and reconstruction with plantaris autograft and GraftJacket xenograft augmentation 3.  Transfer of the flexor hallucis longus tendon to the calcaneus  SURGEON:  Toni Arthurs, MD  ASSISTANT: n/a  ANESTHESIA:   General, regional  EBL:  minimal   TOURNIQUET:   Total Tourniquet Time Documented: Thigh (Right) - 112 minutes Total: Thigh (Right) - 112 minutes   COMPLICATIONS:  None apparent  DISPOSITION:  Extubated, awake and stable to recovery.  DICTATION ID: [id number]

## 2012-11-03 NOTE — Anesthesia Preprocedure Evaluation (Addendum)
Anesthesia Evaluation  Patient identified by MRN, date of birth, ID band Patient awake    Reviewed: Allergy & Precautions, H&P , NPO status , Patient's Chart, lab work & pertinent test results  Airway Mallampati: II TM Distance: >3 FB Neck ROM: Full    Dental no notable dental hx. (+) Teeth Intact and Dental Advisory Given   Pulmonary neg pulmonary ROS,  breath sounds clear to auscultation  Pulmonary exam normal       Cardiovascular hypertension, Pt. on medications Rhythm:Regular Rate:Normal     Neuro/Psych negative neurological ROS  negative psych ROS   GI/Hepatic negative GI ROS, Neg liver ROS,   Endo/Other  negative endocrine ROS  Renal/GU negative Renal ROS  negative genitourinary   Musculoskeletal   Abdominal   Peds  Hematology negative hematology ROS (+)   Anesthesia Other Findings   Reproductive/Obstetrics negative OB ROS                          Anesthesia Physical Anesthesia Plan  ASA: II  Anesthesia Plan: General and Regional   Post-op Pain Management:    Induction: Intravenous  Airway Management Planned: LMA and Oral ETT  Additional Equipment:   Intra-op Plan:   Post-operative Plan: Extubation in OR  Informed Consent: I have reviewed the patients History and Physical, chart, labs and discussed the procedure including the risks, benefits and alternatives for the proposed anesthesia with the patient or authorized representative who has indicated his/her understanding and acceptance.   Dental advisory given  Plan Discussed with: CRNA  Anesthesia Plan Comments:         Anesthesia Quick Evaluation

## 2012-11-03 NOTE — Anesthesia Postprocedure Evaluation (Signed)
  Anesthesia Post-op Note  Patient: Adam Noble  Procedure(s) Performed: Procedure(s): RIGHT ACHILLES TENDON DEBRIDEMENT/RECONSTRUCTION  (Right) GASTROC RECESSION (Right) FLEXOR HALLUCIS LONGUS TRANSFER TO CALCANEUS  (Right)  Patient Location: PACU  Anesthesia Type:GA combined with regional for post-op pain  Level of Consciousness: awake and alert   Airway and Oxygen Therapy: Patient Spontanous Breathing and Patient connected to face mask oxygen  Post-op Pain: none  Post-op Assessment: Post-op Vital signs reviewed, Patient's Cardiovascular Status Stable, Respiratory Function Stable, Patent Airway and No signs of Nausea or vomiting  Post-op Vital Signs: Reviewed and stable  Complications: No apparent anesthesia complications

## 2012-11-03 NOTE — H&P (Signed)
Adam Noble is an 57 y.o. male.   Chief Complaint: right leg pain HPI: 57 y/o male with h/o chronic right achilles tendon tear.  He presents now for reconstruction.  Past Medical History  Diagnosis Date  . Contact lens/glasses fitting     wears contacts or glasses  . Hypertension   . Hyperlipemia   . BPH (benign prostatic hypertrophy)     Past Surgical History  Procedure Laterality Date  . Orif ankle fracture  2009    left  . Tonsillectomy    . Colonoscopy      History reviewed. No pertinent family history. Social History:  reports that he has never smoked. He does not have any smokeless tobacco history on file. He reports that  drinks alcohol. He reports that he does not use illicit drugs.  Allergies: No Known Allergies  Medications Prior to Admission  Medication Sig Dispense Refill  . aspirin 81 MG tablet Take 81 mg by mouth daily.      Marland Kitchen atorvastatin (LIPITOR) 10 MG tablet Take 10 mg by mouth daily.      Marland Kitchen azelastine (ASTELIN) 137 MCG/SPRAY nasal spray Place 1 spray into the nose as needed for rhinitis. Use in each nostril as directed      . finasteride (PROSCAR) 5 MG tablet Take 5 mg by mouth daily.      . hydrochlorothiazide (HYDRODIURIL) 25 MG tablet Take 25 mg by mouth daily.        Results for orders placed during the hospital encounter of 11/20/2012 (from the past 48 hour(s))  POCT I-STAT, CHEM 8     Status: None   Collection Time    11-20-2012  6:55 AM      Result Value Range   Sodium 141  135 - 145 mEq/L   Potassium 3.9  3.5 - 5.1 mEq/L   Chloride 105  96 - 112 mEq/L   BUN 14  6 - 23 mg/dL   Creatinine, Ser 1.61  0.50 - 1.35 mg/dL   Glucose, Bld 84  70 - 99 mg/dL   Calcium, Ion 0.96  0.45 - 1.23 mmol/L   TCO2 23  0 - 100 mmol/L   Hemoglobin 15.0  13.0 - 17.0 g/dL   HCT 40.9  81.1 - 91.4 %   No results found.  ROS  No recent f/c/n/v/wt loss  Blood pressure 143/87, temperature 98.3 F (36.8 C), temperature source Oral, resp. rate 20, height 5' 7.5"  (1.715 m), weight 79.379 kg (175 lb), SpO2 97.00%. Physical Exam  wn wd male in nad.  A adn O x 4.  Mood and affect normal.  EOMI.  resp unlabored.  R leg with swelling and tenderness posteriorlly.  Skin heatlhy and intact.  Sens to LT intact.  5/5 strength in PF.  No lymphadenopathy.  Assessment/Plan R achilles tendon chronic rupture - to OR for gastroc recession, achilles debridement and reconstruction and FHL transfer to the calcaneus.  The risks and benefits of the alternative treatment options have been discussed in detail.  The patient wishes to proceed with surgery and specifically understands risks of bleeding, infection, nerve damage, blood clots, need for additional surgery, amputation and death.   Toni Arthurs 11-20-2012, 7:15 AM

## 2012-11-03 NOTE — Anesthesia Procedure Notes (Addendum)
Anesthesia Regional Block:  Popliteal block  Pre-Anesthetic Checklist: ,, timeout performed, Correct Patient, Correct Site, Correct Laterality, Correct Procedure, Correct Position, site marked, Risks and benefits discussed, pre-op evaluation, post-op pain management  Laterality: Right  Prep: Maximum Sterile Barrier Precautions used and chloraprep       Needles:  Injection technique: Single-shot  Needle Type: Echogenic Stimulator Needle          Additional Needles:  Procedures: ultrasound guided (picture in chart) and nerve stimulator Popliteal block  Nerve Stimulator or Paresthesia:  Response: Peroneal,  Response: Tibial,   Additional Responses:   Narrative:  Start time: 11/03/2012 7:08 AM End time: 11/03/2012 7:15 AM Injection made incrementally with aspirations every 5 mL. Anesthesiologist: Sampson Goon, MD  Additional Notes: 2% Lidocaine skin wheel. Saphenous block with 10cc of 0.5% Bupivicaine plain.  Popliteal block Procedure Name: Intubation Date/Time: 11/03/2012 7:35 AM Performed by: Satomi Buda D Pre-anesthesia Checklist: Patient identified, Emergency Drugs available, Suction available and Patient being monitored Patient Re-evaluated:Patient Re-evaluated prior to inductionOxygen Delivery Method: Circle System Utilized Preoxygenation: Pre-oxygenation with 100% oxygen Intubation Type: IV induction Ventilation: Mask ventilation without difficulty Laryngoscope Size: Mac and 3 Tube type: Oral Tube size: 7.0 mm Number of attempts: 2 Airway Equipment and Method: stylet and Video-laryngoscopy Placement Confirmation: ETT inserted through vocal cords under direct vision,  positive ETCO2 and breath sounds checked- equal and bilateral Secured at: 21 cm Tube secured with: Tape Dental Injury: Teeth and Oropharynx as per pre-operative assessment  Comments: DL with mac3 unable to visualize VC esphageal intubation with immediate recognition. DL with glide scope VC  visualized easy placement of 7.0 ETT equal BBS, + ETCO2, dentition unchanged

## 2012-11-03 NOTE — Transfer of Care (Signed)
Immediate Anesthesia Transfer of Care Note  Patient: Adam Noble  Procedure(s) Performed: Procedure(s): RIGHT ACHILLES TENDON DEBRIDEMENT/RECONSTRUCTION  (Right) GASTROC RECESSION (Right) FLEXOR HALLUCIS LONGUS TRANSFER TO CALCANEUS  (Right)  Patient Location: PACU  Anesthesia Type:GA combined with regional for post-op pain  Level of Consciousness: awake, alert  and patient cooperative  Airway & Oxygen Therapy: Patient Spontanous Breathing and Patient connected to face mask oxygen  Post-op Assessment: Report given to PACU RN and Post -op Vital signs reviewed and stable  Post vital signs: Reviewed and stable  Complications: No apparent anesthesia complications

## 2012-11-08 ENCOUNTER — Encounter (HOSPITAL_BASED_OUTPATIENT_CLINIC_OR_DEPARTMENT_OTHER): Payer: Self-pay | Admitting: Orthopedic Surgery

## 2014-12-24 ENCOUNTER — Telehealth: Payer: Self-pay

## 2014-12-24 NOTE — Telephone Encounter (Signed)
Patient called to schedule and appt to get his meds. refilled. Patient would like to know could we refill the medications since he made an appt. [facility].  Please Advise.  Thanks.

## 2014-12-25 ENCOUNTER — Other Ambulatory Visit: Payer: Self-pay | Admitting: *Deleted

## 2014-12-25 MED ORDER — MONTELUKAST SODIUM 10 MG PO TABS: 10.0000 mg | ORAL_TABLET | Freq: Every day | ORAL | Status: DC

## 2014-12-25 NOTE — Telephone Encounter (Signed)
Spoke with patient - I informed him that we could not give a 3 month supply until he comes in for an appointment.  I printed off one 30-day prescription for Montelukast and left up front for him to pick it up.

## 2015-01-04 DIAGNOSIS — J309 Allergic rhinitis, unspecified: Secondary | ICD-10-CM | POA: Insufficient documentation

## 2015-01-08 ENCOUNTER — Ambulatory Visit (INDEPENDENT_AMBULATORY_CARE_PROVIDER_SITE_OTHER): Payer: BLUE CROSS/BLUE SHIELD | Admitting: Allergy and Immunology

## 2015-01-08 ENCOUNTER — Encounter: Payer: Self-pay | Admitting: Allergy and Immunology

## 2015-01-08 VITALS — BP 138/88 | HR 74 | Resp 16

## 2015-01-08 DIAGNOSIS — J3089 Other allergic rhinitis: Secondary | ICD-10-CM

## 2015-01-09 NOTE — Patient Instructions (Signed)
  1. Continue Fluticasone nasal spray 2 sprays each nostril one time per day  2. May use one spray of OTC Afrin each nostril at bedtime. Do not exceed one spray.  3. Can use antihistamine as needed.  4. Get flu vaccine  5. Consider immunotherapy  6. Return in one year or earlier if problem

## 2015-01-09 NOTE — Progress Notes (Signed)
[facility]  Follow-up Note    Subjective:   Adam Noble is a 59 y.o. male who returns to the [facility] in re-evaluation of the following:  HPI Comments:  [name] return to this clinic after one year of follow-up. He is noticed that he has redeveloped significant problems with congestion at nighttime. He will wake up in the morning with a stall headache that lasts about 15 or 20 minutes or so. He wakes up with lots of mucus in his nose. This occurs even though he is doing his nasal fluticasone and Astepro on a regular basis along with montelukast daily. He stopped his montelukast 2 weeks ago and can't really tell a difference. About one time a week he'll use a spray of Afrin before bedtime and he sleeps great throughout the night and wakes up in the morning without a headache or without mucus production. He has no symptoms to suggest sleep apnea. He is not sleepy when he wakes up. He is not sleepy during the daytime.    Current outpatient prescriptions:  .  aspirin EC 325 MG tablet, Take 1 tablet (325 mg total) by mouth daily., Disp: 42 tablet, Rfl: 0 .  atorvastatin (LIPITOR) 10 MG tablet, Take 10 mg by mouth daily., Disp: , Rfl:  .  azelastine (ASTELIN) 137 MCG/SPRAY nasal spray, Place 1 spray into the nose 2 (two) times daily. Use in each nostril as directed, Disp: , Rfl:  .  finasteride (PROSCAR) 5 MG tablet, Take 5 mg by mouth daily., Disp: , Rfl:  .  fluticasone (FLONASE) 50 MCG/ACT nasal spray, Place 1 spray into both nostrils 2 (two) times daily., Disp: , Rfl:  .  hydrochlorothiazide (HYDRODIURIL) 25 MG tablet, Take 25 mg by mouth daily., Disp: , Rfl:  .  montelukast (SINGULAIR) 10 MG tablet, Take 1 tablet (10 mg total) by mouth daily., Disp: 30 tablet, Rfl: 0 .  oxyCODONE (ROXICODONE) 5 MG immediate release tablet, Take 1-2 tablets (5-10 mg total) by mouth every 4 (four) hours as needed for pain. (Patient not  taking: Reported on 01/08/2015), Disp: 50 tablet, Rfl: 0  No orders of the defined types were placed in this encounter.    Past Medical History  Diagnosis Date  . Contact lens/glasses fitting     wears contacts or glasses  . Hypertension   . Hyperlipemia   . BPH (benign prostatic hypertrophy)     Past Surgical History  Procedure Laterality Date  . Orif ankle fracture  2009    left  . Tonsillectomy    . Colonoscopy    . Achilles tendon surgery Right 11/03/2012    Procedure: RIGHT ACHILLES TENDON DEBRIDEMENT/RECONSTRUCTION ;  Surgeon: Wylene Simmer, MD;  Location: [facility];  Service: Orthopedics;  Laterality: Right;  . Gastrocnemius recession Right 11/03/2012    Procedure: GASTROC RECESSION;  Surgeon: Wylene Simmer, MD;  Location: [facility];  Service: Orthopedics;  Laterality: Right;  . Tendon transfer Right 11/03/2012    Procedure: FLEXOR HALLUCIS LONGUS TRANSFER TO CALCANEUS ;  Surgeon: Wylene Simmer, MD;  Location: [facility];  Service: Orthopedics;  Laterality: Right;    No Known Allergies  Review of Systems  Constitutional: Negative for fever, chills and weight loss.  HENT: Positive for congestion. Negative for ear pain, nosebleeds and sore throat.   Eyes: Negative for discharge and redness.  Respiratory: Negative for cough, shortness of breath and wheezing.  Cardiovascular: Negative for chest pain.  Gastrointestinal: Negative for heartburn, nausea and vomiting.  Neurological: Positive for headaches.     Objective:   Filed Vitals:   01/08/15 1827  BP: 138/88  Pulse: 74  Resp: 16    Physical Exam  Constitutional: He is well-developed, well-nourished, and in no distress. No distress.  HENT:  Head: Normocephalic and atraumatic. Head is without right periorbital erythema and without left periorbital erythema.  Right Ear: Tympanic membrane, external ear and ear canal normal. No drainage. No foreign bodies. Tympanic membrane  is not injected, not scarred, not perforated, not erythematous, not retracted and not bulging. No middle ear effusion.  Left Ear: Tympanic membrane, external ear and ear canal normal. No drainage. No foreign bodies. Tympanic membrane is not injected, not scarred, not perforated, not erythematous, not retracted and not bulging.  No middle ear effusion.  Nose: Nose normal. No mucosal edema, rhinorrhea, nose lacerations, sinus tenderness, nasal deformity, septal deviation or nasal septal hematoma. No epistaxis.  Mouth/Throat: Oropharynx is clear and moist and mucous membranes are normal. No oropharyngeal exudate, posterior oropharyngeal edema, posterior oropharyngeal erythema or tonsillar abscesses.  Eyes: Conjunctivae and lids are normal. Pupils are equal, round, and reactive to light. Right eye exhibits no discharge and no exudate. No foreign body present in the right eye. Left eye exhibits no discharge and no exudate. No foreign body present in the left eye. Right conjunctiva is not injected. Right conjunctiva has no hemorrhage. Left conjunctiva is not injected. Left conjunctiva has no hemorrhage. No scleral icterus.  Neck: No tracheal tenderness present. No tracheal deviation present. No thyromegaly present.  Cardiovascular: Normal rate, regular rhythm, S1 normal, S2 normal and normal heart sounds.  Exam reveals no gallop and no friction rub.   No murmur heard. Pulmonary/Chest: Effort normal. No stridor. No respiratory distress. He has no wheezes. He has no rhonchi. He has no rales. He exhibits no tenderness.  Musculoskeletal: He exhibits no edema or tenderness.  Lymphadenopathy:    He has no cervical adenopathy.  Skin: No purpura and no rash noted. Rash is not macular, not maculopapular, not nodular, not pustular, not vesicular and not urticarial. He is not diaphoretic. No cyanosis or erythema. No pallor. Nails show no clubbing.  Psychiatric: Mood and affect normal.    Diagnostics: None     Assessment and Plan:   1. Other allergic rhinitis        1. Continue Fluticasone nasal spray 2 sprays each nostril one time per day  2. May use one spray of OTC Afrin each nostril at bedtime. Do not exceed one spray.  3. Can use antihistamine as needed.  4. Get flu vaccine  5. Consider immunotherapy  6. Return in one year or earlier if problem  We will let Nikolas is a combination of nasal fluticasone and over-the-counter nasal decongestant spray at nighttime. There is literature to suggest that he will not develop a rebound phenomenon with the use of his over-the-counter Afrin as long as he continues to use nasal fluticasone. He would deathly be a candidate for immunotherapy and I have given him literature on this form of therapy during today's visit. He is presently considering this option.     Allena Katz, MD [facility]

## 2015-02-08 ENCOUNTER — Other Ambulatory Visit: Payer: Self-pay | Admitting: Allergy and Immunology

## 2016-04-30 ENCOUNTER — Encounter: Payer: Self-pay | Admitting: Cardiology

## 2016-05-04 ENCOUNTER — Encounter: Payer: Self-pay | Admitting: Cardiology

## 2016-05-04 ENCOUNTER — Encounter (INDEPENDENT_AMBULATORY_CARE_PROVIDER_SITE_OTHER): Payer: Self-pay

## 2016-05-04 ENCOUNTER — Ambulatory Visit (INDEPENDENT_AMBULATORY_CARE_PROVIDER_SITE_OTHER): Payer: BLUE CROSS/BLUE SHIELD | Admitting: Cardiology

## 2016-05-04 VITALS — BP 138/88 | HR 71 | Ht 67.5 in | Wt 174.0 lb

## 2016-05-04 DIAGNOSIS — I10 Essential (primary) hypertension: Secondary | ICD-10-CM | POA: Diagnosis not present

## 2016-05-04 DIAGNOSIS — Z8249 Family history of ischemic heart disease and other diseases of the circulatory system: Secondary | ICD-10-CM

## 2016-05-04 DIAGNOSIS — Z8279 Family history of other congenital malformations, deformations and chromosomal abnormalities: Secondary | ICD-10-CM

## 2016-05-04 NOTE — Patient Instructions (Signed)
Medication Instructions:  The current medical regimen is effective;  continue present plan and medications.  Testing/Procedures: Your physician has requested that you have an echocardiogram. Echocardiography is a painless test that uses sound waves to create images of your heart. It provides your doctor with information about the size and shape of your heart and how well your heart's chambers and valves are working. This procedure takes approximately one hour. There are no restrictions for this procedure.  Your physician has requested that you have an exercise tolerance test. For further information please visit www.cardiosmart.org. Please also follow instruction sheet, as given.  Follow-Up: Follow up as needed after the above testing.   Thank you for choosing [facility]!!     

## 2016-05-04 NOTE — Progress Notes (Signed)
Cardiology Office Note    Date:  05/04/2016   ID:  Adam Noble, DOB 1955/09/18, MRN [id number]  PCP:  Haywood Pao, MD  Cardiologist:   Candee Furbish, MD     History of Present Illness:  Adam Noble is a 61 y.o. male here for the evaluation of family history of cardiovascular disease. His brother was recently diagnosed with aortic valve bicuspid, patent foramen ovale-PFO, had 2 myocardial infarctions and CABG and a recent stroke. His brother is 28. His brother had his first myocardial infarction in early 51s. His father also had significant cardiac issues at 22. His brothers cardiologist recommended referral for echocardiogram.  He has been taking statin therapy and is active. Never smoked. Uses elliptical machine, walking.  He has recently been diagnosed with hypertension. Trying to control this with HCTZ.  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 enjoys guitar has 2 children.  He's not having any chest pain, shortness of breath, syncope, fevers, chills, orthopnea, PND.  He has been logging his blood pressures at home. [id number] systolic recently.    Past Medical History:  Diagnosis Date  . BPH (benign prostatic hypertrophy)   . Contact lens/glasses fitting    wears contacts or glasses  . Hyperlipemia   . Hypertension     Past Surgical History:  Procedure Laterality Date  . ACHILLES TENDON SURGERY Right 11/03/2012   Procedure: RIGHT ACHILLES TENDON DEBRIDEMENT/RECONSTRUCTION ;  Surgeon: Wylene Simmer, MD;  Location: [facility];  Service: Orthopedics;  Laterality: Right;  . COLONOSCOPY    . GASTROCNEMIUS RECESSION Right 11/03/2012   Procedure: GASTROC RECESSION;  Surgeon: Wylene Simmer, MD;  Location: [facility];  Service: Orthopedics;  Laterality: Right;  . ORIF ANKLE FRACTURE  2009   left  . TENDON TRANSFER Right 11/03/2012   Procedure: FLEXOR HALLUCIS LONGUS TRANSFER TO CALCANEUS ;  Surgeon: Wylene Simmer, MD;  Location: [facility];  Service: Orthopedics;  Laterality: Right;  . TONSILLECTOMY      Current Medications: Outpatient Medications Prior to Visit  Medication Sig Dispense Refill  . atorvastatin (LIPITOR) 10 MG tablet Take 10 mg by mouth daily.    . finasteride (PROSCAR) 5 MG tablet Take 5 mg by mouth daily.    . fluticasone (FLONASE) 50 MCG/ACT nasal spray USE 1 TO 2 SPRAYS IN EACH NOSTRIL ONCE DAILY AS DIRECTED 48 g 3  . hydrochlorothiazide (HYDRODIURIL) 25 MG tablet Take 25 mg by mouth daily.    Marland Kitchen aspirin EC 325 MG tablet Take 1 tablet (325 mg total) by mouth daily. (Patient not taking: Reported on 05/04/2016) 42 tablet 0  . azelastine (ASTELIN) 137 MCG/SPRAY nasal spray Place 1 spray into the nose 2 (two) times daily. Use in each nostril as directed    . montelukast (SINGULAIR) 10 MG tablet Take 1 tablet (10 mg total) by mouth daily. (Patient not taking: Reported on 05/04/2016) 30 tablet 0  . oxyCODONE (ROXICODONE) 5 MG immediate release tablet Take 1-2 tablets (5-10 mg total) by mouth every 4 (four) hours as needed for pain. (Patient not taking: Reported on 05/04/2016) 50 tablet 0   No facility-administered medications prior to visit.      Allergies:   Patient has no known allergies.   Social History   Social History  . Marital status: Married    Spouse name: N/A  . Number of children: N/A  . Years of education: N/A   Social History Main  Topics  . Smoking status: Never Smoker  . Smokeless tobacco: Never Used  . Alcohol use Yes     Comment: occ  . Drug use: No  . Sexual activity: Not Asked   Other Topics Concern  . None   Social History Narrative  . None     Family History:  Brother with bicuspid aortic valve, CABG age 44. Father MI age 86   ROS:   Please see the history of present illness.    ROS All other systems reviewed and are negative.   PHYSICAL EXAM:   VS:  BP 138/88   Pulse 71   Ht 5' 7.5" (1.715 m)   Wt 174 lb (78.9 kg)    BMI 26.85 kg/m    GEN: Well nourished, well developed, in no acute distress  HEENT: normal  Neck: no JVD, carotid bruits, or masses Cardiac: RRR; no murmurs, rubs, or gallops,no edema  Respiratory:  clear to auscultation bilaterally, normal work of breathing GI: soft, nontender, nondistended, + BS MS: no deformity or atrophy  Skin: warm and dry, no rash Neuro:  Alert and Oriented x 3, Strength and sensation are intact Psych: euthymic mood, full affect  Wt Readings from Last 3 Encounters:  05/04/16 174 lb (78.9 kg)  11/03/12 175 lb (79.4 kg)      Studies/Labs Reviewed:   EKG:  EKG is ordered today.  The ekg ordered today demonstrates Normal sinus rhythm 71 with no other abnormalities  Recent Labs: No results found for requested labs within last 8760 hours.   Lipid Panel No results found for: CHOL, TRIG, HDL, CHOLHDL, VLDL, LDLCALC, LDLDIRECT  Additional studies/ records that were reviewed today include:  LDL 87, HDL 37. Prior office notes reviewed, EKG reviewed    ASSESSMENT:    1. Essential hypertension   2. Family history of early CAD   3. Family history of bicuspid aortic valve      PLAN:  In order of problems listed above:  Family history bicuspid aortic valve/early coronary artery disease  - His brother hasn't bicuspid aortic valve. It was recommended by his cardiologist that he had an echocardiogram to make sure that he does not have this congenital defect as well. We will check an echocardiogram.   - I will also check an exercise treadmill test to make sure that he does not have any evidence of high risk ischemia especially given his brothers heart history and his father's heart history as well. His brother was an avid cyclist took care of himself however ended up needing bypass surgery. Continue with aggressive primary prevention.  Hyperlipidemia  - Low-dose atorvastatin 10 mg. Doing well.  Essential hypertension  - Blood pressure mildly elevated. He showed  me a lot at home ranging from the [id number] to 0000000 systolic. Most of his readings are in the 130 to 140 range. He has increased his HCTZ to 25 mg once a day. He may in fact need a combination pill such as losartan HCTZ.    Medication Adjustments/Labs and Tests Ordered: Current medicines are reviewed at length with the patient today.  Concerns regarding medicines are outlined above.  Medication changes, Labs and Tests ordered today are listed in the Patient Instructions below. Patient Instructions  Medication Instructions:  The current medical regimen is effective;  continue present plan and medications.  Testing/Procedures: Your physician has requested that you have an echocardiogram. Echocardiography is a painless test that uses sound waves to create images of your heart. It provides your  doctor with information about the size and shape of your heart and how well your heart's chambers and valves are working. This procedure takes approximately one hour. There are no restrictions for this procedure.  Your physician has requested that you have an exercise tolerance test. For further information please visit [email or web address]. Please also follow instruction sheet, as given.  Follow-Up: Follow up as needed after the above testing.  Thank you for choosing [facility]!!        Signed, Candee Furbish, MD  05/04/2016 1:47 PM    [facility] [address], [place], [place]  [address] Phone: [phone]; Fax: [phone]

## 2016-05-20 ENCOUNTER — Other Ambulatory Visit: Payer: Self-pay

## 2016-05-20 ENCOUNTER — Ambulatory Visit (HOSPITAL_COMMUNITY): Payer: BLUE CROSS/BLUE SHIELD | Attending: Cardiovascular Disease

## 2016-05-20 ENCOUNTER — Ambulatory Visit (INDEPENDENT_AMBULATORY_CARE_PROVIDER_SITE_OTHER): Payer: BLUE CROSS/BLUE SHIELD

## 2016-05-20 DIAGNOSIS — I10 Essential (primary) hypertension: Secondary | ICD-10-CM | POA: Diagnosis not present

## 2016-05-20 DIAGNOSIS — Z8249 Family history of ischemic heart disease and other diseases of the circulatory system: Secondary | ICD-10-CM | POA: Insufficient documentation

## 2016-05-20 DIAGNOSIS — Z8279 Family history of other congenital malformations, deformations and chromosomal abnormalities: Secondary | ICD-10-CM

## 2016-05-20 DIAGNOSIS — E785 Hyperlipidemia, unspecified: Secondary | ICD-10-CM | POA: Insufficient documentation

## 2016-05-20 LAB — EXERCISE TOLERANCE TEST
CHL CUP RESTING HR STRESS: 63 {beats}/min
CHL RATE OF PERCEIVED EXERTION: 17
CSEPEW: 13.4 METS
CSEPPHR: 173 {beats}/min
Exercise duration (min): 11 min
Exercise duration (sec): 32 s
MPHR: 160 {beats}/min
Percent HR: 108 %

## 2016-05-25 ENCOUNTER — Telehealth: Payer: Self-pay | Admitting: Cardiology

## 2016-05-25 NOTE — Telephone Encounter (Signed)
Reviewed results of echo and exercise tolerance test to patient who verbalized understanding. I answered questions to his satisfaction including that follow-up is PRN.  I advised him to call back with questions or concerns in the future and he thanked me for the call.

## 2016-05-25 NOTE — Telephone Encounter (Signed)
New Message    Please call returning Altha Harm call for Stress test results

## 2018-01-26 DIAGNOSIS — J301 Allergic rhinitis due to pollen: Secondary | ICD-10-CM | POA: Diagnosis not present

## 2018-01-26 DIAGNOSIS — J3081 Allergic rhinitis due to animal (cat) (dog) hair and dander: Secondary | ICD-10-CM | POA: Diagnosis not present

## 2018-01-26 DIAGNOSIS — J3089 Other allergic rhinitis: Secondary | ICD-10-CM | POA: Diagnosis not present

## 2018-02-03 DIAGNOSIS — J3089 Other allergic rhinitis: Secondary | ICD-10-CM | POA: Diagnosis not present

## 2018-02-03 DIAGNOSIS — J301 Allergic rhinitis due to pollen: Secondary | ICD-10-CM | POA: Diagnosis not present

## 2018-02-03 DIAGNOSIS — J3081 Allergic rhinitis due to animal (cat) (dog) hair and dander: Secondary | ICD-10-CM | POA: Diagnosis not present

## 2018-02-09 DIAGNOSIS — J3089 Other allergic rhinitis: Secondary | ICD-10-CM | POA: Diagnosis not present

## 2018-02-09 DIAGNOSIS — J301 Allergic rhinitis due to pollen: Secondary | ICD-10-CM | POA: Diagnosis not present

## 2018-02-09 DIAGNOSIS — J3081 Allergic rhinitis due to animal (cat) (dog) hair and dander: Secondary | ICD-10-CM | POA: Diagnosis not present

## 2018-02-23 DIAGNOSIS — J3081 Allergic rhinitis due to animal (cat) (dog) hair and dander: Secondary | ICD-10-CM | POA: Diagnosis not present

## 2018-02-23 DIAGNOSIS — J301 Allergic rhinitis due to pollen: Secondary | ICD-10-CM | POA: Diagnosis not present

## 2018-02-23 DIAGNOSIS — J3089 Other allergic rhinitis: Secondary | ICD-10-CM | POA: Diagnosis not present

## 2018-02-28 DIAGNOSIS — J301 Allergic rhinitis due to pollen: Secondary | ICD-10-CM | POA: Diagnosis not present

## 2018-02-28 DIAGNOSIS — J3081 Allergic rhinitis due to animal (cat) (dog) hair and dander: Secondary | ICD-10-CM | POA: Diagnosis not present

## 2018-02-28 DIAGNOSIS — J3089 Other allergic rhinitis: Secondary | ICD-10-CM | POA: Diagnosis not present

## 2018-03-08 DIAGNOSIS — J3081 Allergic rhinitis due to animal (cat) (dog) hair and dander: Secondary | ICD-10-CM | POA: Diagnosis not present

## 2018-03-08 DIAGNOSIS — J301 Allergic rhinitis due to pollen: Secondary | ICD-10-CM | POA: Diagnosis not present

## 2018-03-08 DIAGNOSIS — J3089 Other allergic rhinitis: Secondary | ICD-10-CM | POA: Diagnosis not present

## 2018-03-21 DIAGNOSIS — J3081 Allergic rhinitis due to animal (cat) (dog) hair and dander: Secondary | ICD-10-CM | POA: Diagnosis not present

## 2018-03-21 DIAGNOSIS — J301 Allergic rhinitis due to pollen: Secondary | ICD-10-CM | POA: Diagnosis not present

## 2018-03-21 DIAGNOSIS — J3089 Other allergic rhinitis: Secondary | ICD-10-CM | POA: Diagnosis not present

## 2018-04-05 DIAGNOSIS — J3089 Other allergic rhinitis: Secondary | ICD-10-CM | POA: Diagnosis not present

## 2018-04-05 DIAGNOSIS — J3081 Allergic rhinitis due to animal (cat) (dog) hair and dander: Secondary | ICD-10-CM | POA: Diagnosis not present

## 2018-04-05 DIAGNOSIS — J301 Allergic rhinitis due to pollen: Secondary | ICD-10-CM | POA: Diagnosis not present

## 2018-04-18 DIAGNOSIS — J3081 Allergic rhinitis due to animal (cat) (dog) hair and dander: Secondary | ICD-10-CM | POA: Diagnosis not present

## 2018-04-18 DIAGNOSIS — J3089 Other allergic rhinitis: Secondary | ICD-10-CM | POA: Diagnosis not present

## 2018-04-18 DIAGNOSIS — J301 Allergic rhinitis due to pollen: Secondary | ICD-10-CM | POA: Diagnosis not present

## 2018-05-02 DIAGNOSIS — J3081 Allergic rhinitis due to animal (cat) (dog) hair and dander: Secondary | ICD-10-CM | POA: Diagnosis not present

## 2018-05-02 DIAGNOSIS — J3089 Other allergic rhinitis: Secondary | ICD-10-CM | POA: Diagnosis not present

## 2018-05-02 DIAGNOSIS — J301 Allergic rhinitis due to pollen: Secondary | ICD-10-CM | POA: Diagnosis not present

## 2018-05-16 DIAGNOSIS — J3089 Other allergic rhinitis: Secondary | ICD-10-CM | POA: Diagnosis not present

## 2018-05-16 DIAGNOSIS — J3081 Allergic rhinitis due to animal (cat) (dog) hair and dander: Secondary | ICD-10-CM | POA: Diagnosis not present

## 2018-05-16 DIAGNOSIS — J301 Allergic rhinitis due to pollen: Secondary | ICD-10-CM | POA: Diagnosis not present

## 2018-05-30 DIAGNOSIS — J3089 Other allergic rhinitis: Secondary | ICD-10-CM | POA: Diagnosis not present

## 2018-05-30 DIAGNOSIS — J3081 Allergic rhinitis due to animal (cat) (dog) hair and dander: Secondary | ICD-10-CM | POA: Diagnosis not present

## 2018-05-30 DIAGNOSIS — J301 Allergic rhinitis due to pollen: Secondary | ICD-10-CM | POA: Diagnosis not present

## 2018-06-28 DIAGNOSIS — J3081 Allergic rhinitis due to animal (cat) (dog) hair and dander: Secondary | ICD-10-CM | POA: Diagnosis not present

## 2018-06-28 DIAGNOSIS — J301 Allergic rhinitis due to pollen: Secondary | ICD-10-CM | POA: Diagnosis not present

## 2018-06-28 DIAGNOSIS — J3089 Other allergic rhinitis: Secondary | ICD-10-CM | POA: Diagnosis not present

## 2018-07-26 DIAGNOSIS — J301 Allergic rhinitis due to pollen: Secondary | ICD-10-CM | POA: Diagnosis not present

## 2018-07-26 DIAGNOSIS — J3081 Allergic rhinitis due to animal (cat) (dog) hair and dander: Secondary | ICD-10-CM | POA: Diagnosis not present

## 2018-07-26 DIAGNOSIS — J3089 Other allergic rhinitis: Secondary | ICD-10-CM | POA: Diagnosis not present

## 2018-07-31 DIAGNOSIS — J301 Allergic rhinitis due to pollen: Secondary | ICD-10-CM | POA: Diagnosis not present

## 2018-07-31 DIAGNOSIS — J3089 Other allergic rhinitis: Secondary | ICD-10-CM | POA: Diagnosis not present

## 2018-07-31 DIAGNOSIS — J3081 Allergic rhinitis due to animal (cat) (dog) hair and dander: Secondary | ICD-10-CM | POA: Diagnosis not present

## 2018-08-09 DIAGNOSIS — J3089 Other allergic rhinitis: Secondary | ICD-10-CM | POA: Diagnosis not present

## 2018-08-09 DIAGNOSIS — J301 Allergic rhinitis due to pollen: Secondary | ICD-10-CM | POA: Diagnosis not present

## 2018-08-09 DIAGNOSIS — J3081 Allergic rhinitis due to animal (cat) (dog) hair and dander: Secondary | ICD-10-CM | POA: Diagnosis not present

## 2018-08-25 DIAGNOSIS — J301 Allergic rhinitis due to pollen: Secondary | ICD-10-CM | POA: Diagnosis not present

## 2018-08-25 DIAGNOSIS — J3081 Allergic rhinitis due to animal (cat) (dog) hair and dander: Secondary | ICD-10-CM | POA: Diagnosis not present

## 2018-08-25 DIAGNOSIS — J3089 Other allergic rhinitis: Secondary | ICD-10-CM | POA: Diagnosis not present

## 2018-09-06 DIAGNOSIS — J3081 Allergic rhinitis due to animal (cat) (dog) hair and dander: Secondary | ICD-10-CM | POA: Diagnosis not present

## 2018-09-06 DIAGNOSIS — J3089 Other allergic rhinitis: Secondary | ICD-10-CM | POA: Diagnosis not present

## 2018-09-06 DIAGNOSIS — J301 Allergic rhinitis due to pollen: Secondary | ICD-10-CM | POA: Diagnosis not present

## 2018-09-20 DIAGNOSIS — J3089 Other allergic rhinitis: Secondary | ICD-10-CM | POA: Diagnosis not present

## 2018-09-20 DIAGNOSIS — J301 Allergic rhinitis due to pollen: Secondary | ICD-10-CM | POA: Diagnosis not present

## 2018-09-20 DIAGNOSIS — J3081 Allergic rhinitis due to animal (cat) (dog) hair and dander: Secondary | ICD-10-CM | POA: Diagnosis not present

## 2018-10-04 DIAGNOSIS — J301 Allergic rhinitis due to pollen: Secondary | ICD-10-CM | POA: Diagnosis not present

## 2018-10-04 DIAGNOSIS — J3089 Other allergic rhinitis: Secondary | ICD-10-CM | POA: Diagnosis not present

## 2018-10-04 DIAGNOSIS — J3081 Allergic rhinitis due to animal (cat) (dog) hair and dander: Secondary | ICD-10-CM | POA: Diagnosis not present

## 2018-10-05 DIAGNOSIS — L814 Other melanin hyperpigmentation: Secondary | ICD-10-CM | POA: Diagnosis not present

## 2018-10-05 DIAGNOSIS — Z85828 Personal history of other malignant neoplasm of skin: Secondary | ICD-10-CM | POA: Diagnosis not present

## 2018-10-05 DIAGNOSIS — D225 Melanocytic nevi of trunk: Secondary | ICD-10-CM | POA: Diagnosis not present

## 2018-10-05 DIAGNOSIS — D1801 Hemangioma of skin and subcutaneous tissue: Secondary | ICD-10-CM | POA: Diagnosis not present

## 2018-10-05 DIAGNOSIS — L820 Inflamed seborrheic keratosis: Secondary | ICD-10-CM | POA: Diagnosis not present

## 2018-10-05 DIAGNOSIS — L570 Actinic keratosis: Secondary | ICD-10-CM | POA: Diagnosis not present

## 2018-10-17 DIAGNOSIS — I10 Essential (primary) hypertension: Secondary | ICD-10-CM | POA: Diagnosis not present

## 2018-10-17 DIAGNOSIS — Z125 Encounter for screening for malignant neoplasm of prostate: Secondary | ICD-10-CM | POA: Diagnosis not present

## 2018-10-17 DIAGNOSIS — R82998 Other abnormal findings in urine: Secondary | ICD-10-CM | POA: Diagnosis not present

## 2018-10-17 DIAGNOSIS — E7800 Pure hypercholesterolemia, unspecified: Secondary | ICD-10-CM | POA: Diagnosis not present

## 2018-10-17 DIAGNOSIS — Z0000 Encounter for general adult medical examination without abnormal findings: Secondary | ICD-10-CM | POA: Diagnosis not present

## 2018-10-18 DIAGNOSIS — J3089 Other allergic rhinitis: Secondary | ICD-10-CM | POA: Diagnosis not present

## 2018-10-18 DIAGNOSIS — J301 Allergic rhinitis due to pollen: Secondary | ICD-10-CM | POA: Diagnosis not present

## 2018-10-18 DIAGNOSIS — J3081 Allergic rhinitis due to animal (cat) (dog) hair and dander: Secondary | ICD-10-CM | POA: Diagnosis not present

## 2018-10-19 DIAGNOSIS — E7800 Pure hypercholesterolemia, unspecified: Secondary | ICD-10-CM | POA: Diagnosis not present

## 2018-10-19 DIAGNOSIS — Z1331 Encounter for screening for depression: Secondary | ICD-10-CM | POA: Diagnosis not present

## 2018-10-19 DIAGNOSIS — I519 Heart disease, unspecified: Secondary | ICD-10-CM | POA: Diagnosis not present

## 2018-10-19 DIAGNOSIS — I10 Essential (primary) hypertension: Secondary | ICD-10-CM | POA: Diagnosis not present

## 2018-10-19 DIAGNOSIS — Z0000 Encounter for general adult medical examination without abnormal findings: Secondary | ICD-10-CM | POA: Diagnosis not present

## 2018-10-19 DIAGNOSIS — Z8249 Family history of ischemic heart disease and other diseases of the circulatory system: Secondary | ICD-10-CM | POA: Diagnosis not present

## 2018-11-01 DIAGNOSIS — J3081 Allergic rhinitis due to animal (cat) (dog) hair and dander: Secondary | ICD-10-CM | POA: Diagnosis not present

## 2018-11-01 DIAGNOSIS — J301 Allergic rhinitis due to pollen: Secondary | ICD-10-CM | POA: Diagnosis not present

## 2018-11-01 DIAGNOSIS — J3089 Other allergic rhinitis: Secondary | ICD-10-CM | POA: Diagnosis not present

## 2018-11-15 DIAGNOSIS — J3089 Other allergic rhinitis: Secondary | ICD-10-CM | POA: Diagnosis not present

## 2018-11-15 DIAGNOSIS — J301 Allergic rhinitis due to pollen: Secondary | ICD-10-CM | POA: Diagnosis not present

## 2018-11-15 DIAGNOSIS — J3081 Allergic rhinitis due to animal (cat) (dog) hair and dander: Secondary | ICD-10-CM | POA: Diagnosis not present

## 2018-11-29 DIAGNOSIS — J3089 Other allergic rhinitis: Secondary | ICD-10-CM | POA: Diagnosis not present

## 2018-11-29 DIAGNOSIS — J3081 Allergic rhinitis due to animal (cat) (dog) hair and dander: Secondary | ICD-10-CM | POA: Diagnosis not present

## 2018-11-29 DIAGNOSIS — J301 Allergic rhinitis due to pollen: Secondary | ICD-10-CM | POA: Diagnosis not present

## 2018-12-01 DIAGNOSIS — J3081 Allergic rhinitis due to animal (cat) (dog) hair and dander: Secondary | ICD-10-CM | POA: Diagnosis not present

## 2018-12-01 DIAGNOSIS — J301 Allergic rhinitis due to pollen: Secondary | ICD-10-CM | POA: Diagnosis not present

## 2018-12-01 DIAGNOSIS — J3089 Other allergic rhinitis: Secondary | ICD-10-CM | POA: Diagnosis not present

## 2018-12-06 DIAGNOSIS — J3081 Allergic rhinitis due to animal (cat) (dog) hair and dander: Secondary | ICD-10-CM | POA: Diagnosis not present

## 2018-12-06 DIAGNOSIS — J3089 Other allergic rhinitis: Secondary | ICD-10-CM | POA: Diagnosis not present

## 2018-12-06 DIAGNOSIS — J301 Allergic rhinitis due to pollen: Secondary | ICD-10-CM | POA: Diagnosis not present

## 2018-12-13 DIAGNOSIS — J3089 Other allergic rhinitis: Secondary | ICD-10-CM | POA: Diagnosis not present

## 2018-12-13 DIAGNOSIS — J3081 Allergic rhinitis due to animal (cat) (dog) hair and dander: Secondary | ICD-10-CM | POA: Diagnosis not present

## 2018-12-13 DIAGNOSIS — J301 Allergic rhinitis due to pollen: Secondary | ICD-10-CM | POA: Diagnosis not present

## 2018-12-27 DIAGNOSIS — J301 Allergic rhinitis due to pollen: Secondary | ICD-10-CM | POA: Diagnosis not present

## 2018-12-27 DIAGNOSIS — J3081 Allergic rhinitis due to animal (cat) (dog) hair and dander: Secondary | ICD-10-CM | POA: Diagnosis not present

## 2018-12-27 DIAGNOSIS — J3089 Other allergic rhinitis: Secondary | ICD-10-CM | POA: Diagnosis not present

## 2018-12-31 DIAGNOSIS — Z23 Encounter for immunization: Secondary | ICD-10-CM | POA: Diagnosis not present

## 2019-01-04 DIAGNOSIS — R3912 Poor urinary stream: Secondary | ICD-10-CM | POA: Diagnosis not present

## 2019-01-04 DIAGNOSIS — N401 Enlarged prostate with lower urinary tract symptoms: Secondary | ICD-10-CM | POA: Diagnosis not present

## 2019-01-04 DIAGNOSIS — R972 Elevated prostate specific antigen [PSA]: Secondary | ICD-10-CM | POA: Diagnosis not present

## 2019-01-10 DIAGNOSIS — J301 Allergic rhinitis due to pollen: Secondary | ICD-10-CM | POA: Diagnosis not present

## 2019-01-10 DIAGNOSIS — J3081 Allergic rhinitis due to animal (cat) (dog) hair and dander: Secondary | ICD-10-CM | POA: Diagnosis not present

## 2019-01-10 DIAGNOSIS — J3089 Other allergic rhinitis: Secondary | ICD-10-CM | POA: Diagnosis not present

## 2019-01-24 DIAGNOSIS — J3081 Allergic rhinitis due to animal (cat) (dog) hair and dander: Secondary | ICD-10-CM | POA: Diagnosis not present

## 2019-01-24 DIAGNOSIS — J301 Allergic rhinitis due to pollen: Secondary | ICD-10-CM | POA: Diagnosis not present

## 2019-01-24 DIAGNOSIS — J3089 Other allergic rhinitis: Secondary | ICD-10-CM | POA: Diagnosis not present

## 2019-02-07 DIAGNOSIS — J3081 Allergic rhinitis due to animal (cat) (dog) hair and dander: Secondary | ICD-10-CM | POA: Diagnosis not present

## 2019-02-07 DIAGNOSIS — J3089 Other allergic rhinitis: Secondary | ICD-10-CM | POA: Diagnosis not present

## 2019-02-07 DIAGNOSIS — J301 Allergic rhinitis due to pollen: Secondary | ICD-10-CM | POA: Diagnosis not present

## 2019-02-21 DIAGNOSIS — J3089 Other allergic rhinitis: Secondary | ICD-10-CM | POA: Diagnosis not present

## 2019-02-21 DIAGNOSIS — J3081 Allergic rhinitis due to animal (cat) (dog) hair and dander: Secondary | ICD-10-CM | POA: Diagnosis not present

## 2019-02-21 DIAGNOSIS — J301 Allergic rhinitis due to pollen: Secondary | ICD-10-CM | POA: Diagnosis not present

## 2019-02-27 DIAGNOSIS — J3089 Other allergic rhinitis: Secondary | ICD-10-CM | POA: Diagnosis not present

## 2019-02-27 DIAGNOSIS — J3081 Allergic rhinitis due to animal (cat) (dog) hair and dander: Secondary | ICD-10-CM | POA: Diagnosis not present

## 2019-02-27 DIAGNOSIS — J301 Allergic rhinitis due to pollen: Secondary | ICD-10-CM | POA: Diagnosis not present

## 2019-03-07 DIAGNOSIS — J3089 Other allergic rhinitis: Secondary | ICD-10-CM | POA: Diagnosis not present

## 2019-03-07 DIAGNOSIS — J3081 Allergic rhinitis due to animal (cat) (dog) hair and dander: Secondary | ICD-10-CM | POA: Diagnosis not present

## 2019-03-07 DIAGNOSIS — J301 Allergic rhinitis due to pollen: Secondary | ICD-10-CM | POA: Diagnosis not present

## 2019-03-21 DIAGNOSIS — J301 Allergic rhinitis due to pollen: Secondary | ICD-10-CM | POA: Diagnosis not present

## 2019-03-21 DIAGNOSIS — J3089 Other allergic rhinitis: Secondary | ICD-10-CM | POA: Diagnosis not present

## 2019-03-21 DIAGNOSIS — J3081 Allergic rhinitis due to animal (cat) (dog) hair and dander: Secondary | ICD-10-CM | POA: Diagnosis not present

## 2019-04-04 DIAGNOSIS — J3089 Other allergic rhinitis: Secondary | ICD-10-CM | POA: Diagnosis not present

## 2019-04-04 DIAGNOSIS — J3081 Allergic rhinitis due to animal (cat) (dog) hair and dander: Secondary | ICD-10-CM | POA: Diagnosis not present

## 2019-04-04 DIAGNOSIS — J301 Allergic rhinitis due to pollen: Secondary | ICD-10-CM | POA: Diagnosis not present

## 2019-04-17 DIAGNOSIS — Z23 Encounter for immunization: Secondary | ICD-10-CM | POA: Diagnosis not present

## 2019-04-20 DIAGNOSIS — J3081 Allergic rhinitis due to animal (cat) (dog) hair and dander: Secondary | ICD-10-CM | POA: Diagnosis not present

## 2019-04-20 DIAGNOSIS — J3089 Other allergic rhinitis: Secondary | ICD-10-CM | POA: Diagnosis not present

## 2019-04-20 DIAGNOSIS — J301 Allergic rhinitis due to pollen: Secondary | ICD-10-CM | POA: Diagnosis not present

## 2019-05-02 DIAGNOSIS — J3089 Other allergic rhinitis: Secondary | ICD-10-CM | POA: Diagnosis not present

## 2019-05-02 DIAGNOSIS — J3081 Allergic rhinitis due to animal (cat) (dog) hair and dander: Secondary | ICD-10-CM | POA: Diagnosis not present

## 2019-05-02 DIAGNOSIS — J301 Allergic rhinitis due to pollen: Secondary | ICD-10-CM | POA: Diagnosis not present

## 2019-05-08 DIAGNOSIS — Z23 Encounter for immunization: Secondary | ICD-10-CM | POA: Diagnosis not present

## 2019-05-23 DIAGNOSIS — J301 Allergic rhinitis due to pollen: Secondary | ICD-10-CM | POA: Diagnosis not present

## 2019-05-23 DIAGNOSIS — J3089 Other allergic rhinitis: Secondary | ICD-10-CM | POA: Diagnosis not present

## 2019-05-23 DIAGNOSIS — J3081 Allergic rhinitis due to animal (cat) (dog) hair and dander: Secondary | ICD-10-CM | POA: Diagnosis not present

## 2019-06-06 DIAGNOSIS — J301 Allergic rhinitis due to pollen: Secondary | ICD-10-CM | POA: Diagnosis not present

## 2019-06-06 DIAGNOSIS — J3081 Allergic rhinitis due to animal (cat) (dog) hair and dander: Secondary | ICD-10-CM | POA: Diagnosis not present

## 2019-06-06 DIAGNOSIS — J3089 Other allergic rhinitis: Secondary | ICD-10-CM | POA: Diagnosis not present

## 2019-06-20 DIAGNOSIS — J3089 Other allergic rhinitis: Secondary | ICD-10-CM | POA: Diagnosis not present

## 2019-06-20 DIAGNOSIS — J3081 Allergic rhinitis due to animal (cat) (dog) hair and dander: Secondary | ICD-10-CM | POA: Diagnosis not present

## 2019-06-20 DIAGNOSIS — J301 Allergic rhinitis due to pollen: Secondary | ICD-10-CM | POA: Diagnosis not present

## 2019-07-04 DIAGNOSIS — J301 Allergic rhinitis due to pollen: Secondary | ICD-10-CM | POA: Diagnosis not present

## 2019-07-04 DIAGNOSIS — J3081 Allergic rhinitis due to animal (cat) (dog) hair and dander: Secondary | ICD-10-CM | POA: Diagnosis not present

## 2019-07-04 DIAGNOSIS — J3089 Other allergic rhinitis: Secondary | ICD-10-CM | POA: Diagnosis not present

## 2019-07-18 DIAGNOSIS — J301 Allergic rhinitis due to pollen: Secondary | ICD-10-CM | POA: Diagnosis not present

## 2019-07-18 DIAGNOSIS — J3089 Other allergic rhinitis: Secondary | ICD-10-CM | POA: Diagnosis not present

## 2019-07-18 DIAGNOSIS — J3081 Allergic rhinitis due to animal (cat) (dog) hair and dander: Secondary | ICD-10-CM | POA: Diagnosis not present

## 2019-08-01 DIAGNOSIS — J3089 Other allergic rhinitis: Secondary | ICD-10-CM | POA: Diagnosis not present

## 2019-08-01 DIAGNOSIS — J3081 Allergic rhinitis due to animal (cat) (dog) hair and dander: Secondary | ICD-10-CM | POA: Diagnosis not present

## 2019-08-01 DIAGNOSIS — J301 Allergic rhinitis due to pollen: Secondary | ICD-10-CM | POA: Diagnosis not present

## 2019-08-15 DIAGNOSIS — J3081 Allergic rhinitis due to animal (cat) (dog) hair and dander: Secondary | ICD-10-CM | POA: Diagnosis not present

## 2019-08-15 DIAGNOSIS — J3089 Other allergic rhinitis: Secondary | ICD-10-CM | POA: Diagnosis not present

## 2019-08-15 DIAGNOSIS — J301 Allergic rhinitis due to pollen: Secondary | ICD-10-CM | POA: Diagnosis not present

## 2019-08-29 DIAGNOSIS — J3081 Allergic rhinitis due to animal (cat) (dog) hair and dander: Secondary | ICD-10-CM | POA: Diagnosis not present

## 2019-08-29 DIAGNOSIS — J301 Allergic rhinitis due to pollen: Secondary | ICD-10-CM | POA: Diagnosis not present

## 2019-08-29 DIAGNOSIS — J3089 Other allergic rhinitis: Secondary | ICD-10-CM | POA: Diagnosis not present

## 2019-09-12 DIAGNOSIS — J3081 Allergic rhinitis due to animal (cat) (dog) hair and dander: Secondary | ICD-10-CM | POA: Diagnosis not present

## 2019-09-12 DIAGNOSIS — J3089 Other allergic rhinitis: Secondary | ICD-10-CM | POA: Diagnosis not present

## 2019-09-12 DIAGNOSIS — J301 Allergic rhinitis due to pollen: Secondary | ICD-10-CM | POA: Diagnosis not present

## 2019-09-26 DIAGNOSIS — J301 Allergic rhinitis due to pollen: Secondary | ICD-10-CM | POA: Diagnosis not present

## 2019-09-26 DIAGNOSIS — J3089 Other allergic rhinitis: Secondary | ICD-10-CM | POA: Diagnosis not present

## 2019-09-26 DIAGNOSIS — J3081 Allergic rhinitis due to animal (cat) (dog) hair and dander: Secondary | ICD-10-CM | POA: Diagnosis not present

## 2019-10-10 DIAGNOSIS — J3081 Allergic rhinitis due to animal (cat) (dog) hair and dander: Secondary | ICD-10-CM | POA: Diagnosis not present

## 2019-10-10 DIAGNOSIS — J301 Allergic rhinitis due to pollen: Secondary | ICD-10-CM | POA: Diagnosis not present

## 2019-10-10 DIAGNOSIS — J3089 Other allergic rhinitis: Secondary | ICD-10-CM | POA: Diagnosis not present

## 2019-10-13 DIAGNOSIS — J3089 Other allergic rhinitis: Secondary | ICD-10-CM | POA: Diagnosis not present

## 2019-10-13 DIAGNOSIS — J301 Allergic rhinitis due to pollen: Secondary | ICD-10-CM | POA: Diagnosis not present

## 2019-10-13 DIAGNOSIS — J3081 Allergic rhinitis due to animal (cat) (dog) hair and dander: Secondary | ICD-10-CM | POA: Diagnosis not present

## 2019-10-31 DIAGNOSIS — J3089 Other allergic rhinitis: Secondary | ICD-10-CM | POA: Diagnosis not present

## 2019-10-31 DIAGNOSIS — J3081 Allergic rhinitis due to animal (cat) (dog) hair and dander: Secondary | ICD-10-CM | POA: Diagnosis not present

## 2019-10-31 DIAGNOSIS — J301 Allergic rhinitis due to pollen: Secondary | ICD-10-CM | POA: Diagnosis not present

## 2019-11-07 DIAGNOSIS — E7800 Pure hypercholesterolemia, unspecified: Secondary | ICD-10-CM | POA: Diagnosis not present

## 2019-11-07 DIAGNOSIS — Z125 Encounter for screening for malignant neoplasm of prostate: Secondary | ICD-10-CM | POA: Diagnosis not present

## 2019-11-07 DIAGNOSIS — Z0000 Encounter for general adult medical examination without abnormal findings: Secondary | ICD-10-CM | POA: Diagnosis not present

## 2019-11-13 DIAGNOSIS — J3089 Other allergic rhinitis: Secondary | ICD-10-CM | POA: Diagnosis not present

## 2019-11-13 DIAGNOSIS — J301 Allergic rhinitis due to pollen: Secondary | ICD-10-CM | POA: Diagnosis not present

## 2019-11-13 DIAGNOSIS — J3081 Allergic rhinitis due to animal (cat) (dog) hair and dander: Secondary | ICD-10-CM | POA: Diagnosis not present

## 2019-11-14 DIAGNOSIS — R82998 Other abnormal findings in urine: Secondary | ICD-10-CM | POA: Diagnosis not present

## 2019-11-14 DIAGNOSIS — Z0000 Encounter for general adult medical examination without abnormal findings: Secondary | ICD-10-CM | POA: Diagnosis not present

## 2019-11-14 DIAGNOSIS — I10 Essential (primary) hypertension: Secondary | ICD-10-CM | POA: Diagnosis not present

## 2019-11-14 DIAGNOSIS — Z1212 Encounter for screening for malignant neoplasm of rectum: Secondary | ICD-10-CM | POA: Diagnosis not present

## 2019-11-28 DIAGNOSIS — J301 Allergic rhinitis due to pollen: Secondary | ICD-10-CM | POA: Diagnosis not present

## 2019-11-28 DIAGNOSIS — J3081 Allergic rhinitis due to animal (cat) (dog) hair and dander: Secondary | ICD-10-CM | POA: Diagnosis not present

## 2019-11-28 DIAGNOSIS — J3089 Other allergic rhinitis: Secondary | ICD-10-CM | POA: Diagnosis not present

## 2019-12-19 DIAGNOSIS — J3081 Allergic rhinitis due to animal (cat) (dog) hair and dander: Secondary | ICD-10-CM | POA: Diagnosis not present

## 2019-12-19 DIAGNOSIS — J3089 Other allergic rhinitis: Secondary | ICD-10-CM | POA: Diagnosis not present

## 2019-12-19 DIAGNOSIS — J301 Allergic rhinitis due to pollen: Secondary | ICD-10-CM | POA: Diagnosis not present

## 2019-12-24 DIAGNOSIS — Z20822 Contact with and (suspected) exposure to covid-19: Secondary | ICD-10-CM | POA: Diagnosis not present

## 2019-12-24 DIAGNOSIS — J069 Acute upper respiratory infection, unspecified: Secondary | ICD-10-CM | POA: Diagnosis not present

## 2019-12-27 DIAGNOSIS — R059 Cough, unspecified: Secondary | ICD-10-CM | POA: Diagnosis not present

## 2019-12-27 DIAGNOSIS — Z1152 Encounter for screening for COVID-19: Secondary | ICD-10-CM | POA: Diagnosis not present

## 2019-12-27 DIAGNOSIS — I10 Essential (primary) hypertension: Secondary | ICD-10-CM | POA: Diagnosis not present

## 2019-12-27 DIAGNOSIS — J029 Acute pharyngitis, unspecified: Secondary | ICD-10-CM | POA: Diagnosis not present

## 2020-01-02 DIAGNOSIS — L738 Other specified follicular disorders: Secondary | ICD-10-CM | POA: Diagnosis not present

## 2020-01-02 DIAGNOSIS — Z85828 Personal history of other malignant neoplasm of skin: Secondary | ICD-10-CM | POA: Diagnosis not present

## 2020-01-02 DIAGNOSIS — D1801 Hemangioma of skin and subcutaneous tissue: Secondary | ICD-10-CM | POA: Diagnosis not present

## 2020-01-02 DIAGNOSIS — D0362 Melanoma in situ of left upper limb, including shoulder: Secondary | ICD-10-CM | POA: Diagnosis not present

## 2020-01-02 DIAGNOSIS — L570 Actinic keratosis: Secondary | ICD-10-CM | POA: Diagnosis not present

## 2020-01-02 DIAGNOSIS — D225 Melanocytic nevi of trunk: Secondary | ICD-10-CM | POA: Diagnosis not present

## 2020-01-06 DIAGNOSIS — Z23 Encounter for immunization: Secondary | ICD-10-CM | POA: Diagnosis not present

## 2020-01-09 DIAGNOSIS — J3089 Other allergic rhinitis: Secondary | ICD-10-CM | POA: Diagnosis not present

## 2020-01-09 DIAGNOSIS — J3081 Allergic rhinitis due to animal (cat) (dog) hair and dander: Secondary | ICD-10-CM | POA: Diagnosis not present

## 2020-01-09 DIAGNOSIS — J301 Allergic rhinitis due to pollen: Secondary | ICD-10-CM | POA: Diagnosis not present

## 2020-01-15 DIAGNOSIS — D0362 Melanoma in situ of left upper limb, including shoulder: Secondary | ICD-10-CM | POA: Diagnosis not present

## 2020-01-15 DIAGNOSIS — L988 Other specified disorders of the skin and subcutaneous tissue: Secondary | ICD-10-CM | POA: Diagnosis not present

## 2020-01-30 DIAGNOSIS — J3089 Other allergic rhinitis: Secondary | ICD-10-CM | POA: Diagnosis not present

## 2020-01-30 DIAGNOSIS — J301 Allergic rhinitis due to pollen: Secondary | ICD-10-CM | POA: Diagnosis not present

## 2020-01-30 DIAGNOSIS — J3081 Allergic rhinitis due to animal (cat) (dog) hair and dander: Secondary | ICD-10-CM | POA: Diagnosis not present

## 2020-01-31 DIAGNOSIS — R351 Nocturia: Secondary | ICD-10-CM | POA: Diagnosis not present

## 2020-01-31 DIAGNOSIS — R972 Elevated prostate specific antigen [PSA]: Secondary | ICD-10-CM | POA: Diagnosis not present

## 2020-01-31 DIAGNOSIS — N401 Enlarged prostate with lower urinary tract symptoms: Secondary | ICD-10-CM | POA: Diagnosis not present

## 2020-02-23 DIAGNOSIS — J3081 Allergic rhinitis due to animal (cat) (dog) hair and dander: Secondary | ICD-10-CM | POA: Diagnosis not present

## 2020-02-23 DIAGNOSIS — J3089 Other allergic rhinitis: Secondary | ICD-10-CM | POA: Diagnosis not present

## 2020-02-23 DIAGNOSIS — J301 Allergic rhinitis due to pollen: Secondary | ICD-10-CM | POA: Diagnosis not present

## 2020-02-27 DIAGNOSIS — J3081 Allergic rhinitis due to animal (cat) (dog) hair and dander: Secondary | ICD-10-CM | POA: Diagnosis not present

## 2020-02-27 DIAGNOSIS — J3089 Other allergic rhinitis: Secondary | ICD-10-CM | POA: Diagnosis not present

## 2020-02-27 DIAGNOSIS — J301 Allergic rhinitis due to pollen: Secondary | ICD-10-CM | POA: Diagnosis not present

## 2020-03-26 DIAGNOSIS — J301 Allergic rhinitis due to pollen: Secondary | ICD-10-CM | POA: Diagnosis not present

## 2020-03-26 DIAGNOSIS — J3089 Other allergic rhinitis: Secondary | ICD-10-CM | POA: Diagnosis not present

## 2020-03-26 DIAGNOSIS — J3081 Allergic rhinitis due to animal (cat) (dog) hair and dander: Secondary | ICD-10-CM | POA: Diagnosis not present

## 2020-04-16 DIAGNOSIS — L570 Actinic keratosis: Secondary | ICD-10-CM | POA: Diagnosis not present

## 2020-04-16 DIAGNOSIS — L814 Other melanin hyperpigmentation: Secondary | ICD-10-CM | POA: Diagnosis not present

## 2020-04-16 DIAGNOSIS — Z85820 Personal history of malignant melanoma of skin: Secondary | ICD-10-CM | POA: Diagnosis not present

## 2020-04-16 DIAGNOSIS — D485 Neoplasm of uncertain behavior of skin: Secondary | ICD-10-CM | POA: Diagnosis not present

## 2020-04-16 DIAGNOSIS — D2261 Melanocytic nevi of right upper limb, including shoulder: Secondary | ICD-10-CM | POA: Diagnosis not present

## 2020-04-16 DIAGNOSIS — L738 Other specified follicular disorders: Secondary | ICD-10-CM | POA: Diagnosis not present

## 2020-04-23 DIAGNOSIS — J3089 Other allergic rhinitis: Secondary | ICD-10-CM | POA: Diagnosis not present

## 2020-04-23 DIAGNOSIS — J3081 Allergic rhinitis due to animal (cat) (dog) hair and dander: Secondary | ICD-10-CM | POA: Diagnosis not present

## 2020-04-23 DIAGNOSIS — J301 Allergic rhinitis due to pollen: Secondary | ICD-10-CM | POA: Diagnosis not present

## 2020-05-21 DIAGNOSIS — J3089 Other allergic rhinitis: Secondary | ICD-10-CM | POA: Diagnosis not present

## 2020-05-21 DIAGNOSIS — J3081 Allergic rhinitis due to animal (cat) (dog) hair and dander: Secondary | ICD-10-CM | POA: Diagnosis not present

## 2020-05-21 DIAGNOSIS — J301 Allergic rhinitis due to pollen: Secondary | ICD-10-CM | POA: Diagnosis not present

## 2020-05-29 ENCOUNTER — Other Ambulatory Visit (HOSPITAL_COMMUNITY): Payer: Self-pay | Admitting: Internal Medicine

## 2020-05-29 ENCOUNTER — Other Ambulatory Visit: Payer: Self-pay | Admitting: Internal Medicine

## 2020-05-29 ENCOUNTER — Encounter (HOSPITAL_COMMUNITY): Payer: Self-pay

## 2020-05-29 ENCOUNTER — Other Ambulatory Visit: Payer: Self-pay

## 2020-05-29 ENCOUNTER — Ambulatory Visit (HOSPITAL_COMMUNITY)
Admission: RE | Admit: 2020-05-29 | Discharge: 2020-05-29 | Disposition: A | Discharge status: Home / Self Care | Payer: BC Managed Care – PPO | Source: Ambulatory Visit | Attending: Internal Medicine | Admitting: Internal Medicine

## 2020-05-29 DIAGNOSIS — R1012 Left upper quadrant pain: Secondary | ICD-10-CM | POA: Diagnosis not present

## 2020-05-29 DIAGNOSIS — R1902 Left upper quadrant abdominal swelling, mass and lump: Secondary | ICD-10-CM | POA: Insufficient documentation

## 2020-05-29 DIAGNOSIS — I10 Essential (primary) hypertension: Secondary | ICD-10-CM | POA: Diagnosis not present

## 2020-05-29 DIAGNOSIS — R109 Unspecified abdominal pain: Secondary | ICD-10-CM | POA: Diagnosis not present

## 2020-05-29 IMAGING — CT CT ABD-PELV W/ CM
2 of 5 series · 13 of 46 positions shown, 15 images · IV contrast (omnipaque)
Comparison: None.

CLINICAL DATA: Left-sided abdominal pain, abdominal mass

EXAM:
CT ABDOMEN AND PELVIS WITH CONTRAST
TECHNIQUE: Multidetector CT imaging of the abdomen and pelvis was performed
using the standard protocol following bolus administration of
intravenous contrast.
CONTRAST:  100mL OMNIPAQUE IOHEXOL 300 MG/ML  SOLN

[Series 2: axial st · axial · 0.78mm/px · z∈[+1048,+1483]mm · 10 of 101 slices shown, 12 images]
[im 7/101  soft-tissue]
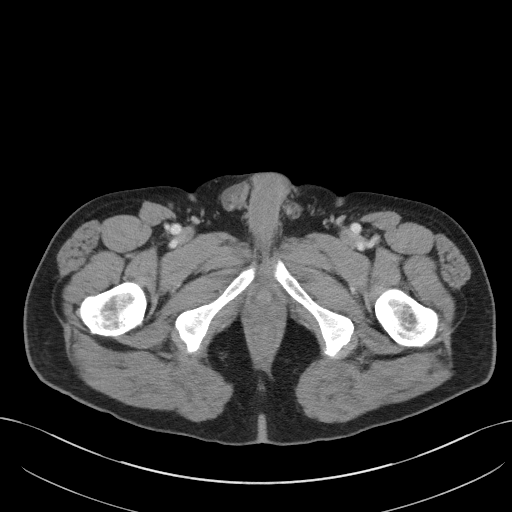
[im 7/101  bone]
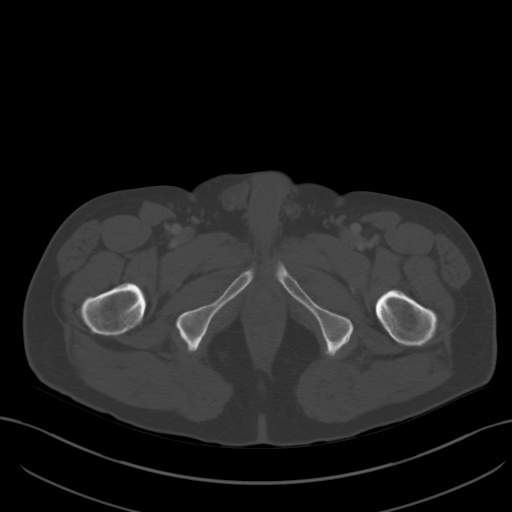
[im 21/101  soft-tissue]
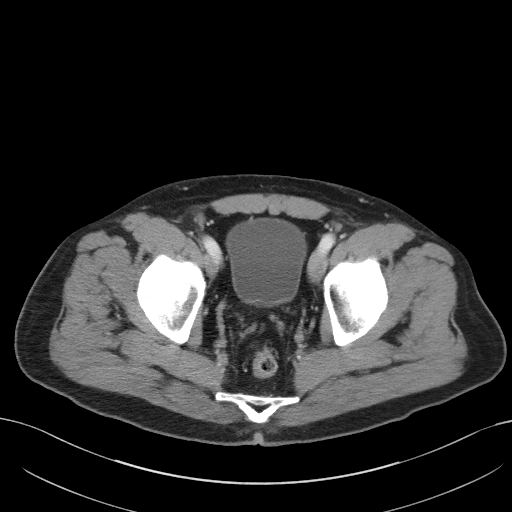
[im 27/101  soft-tissue]
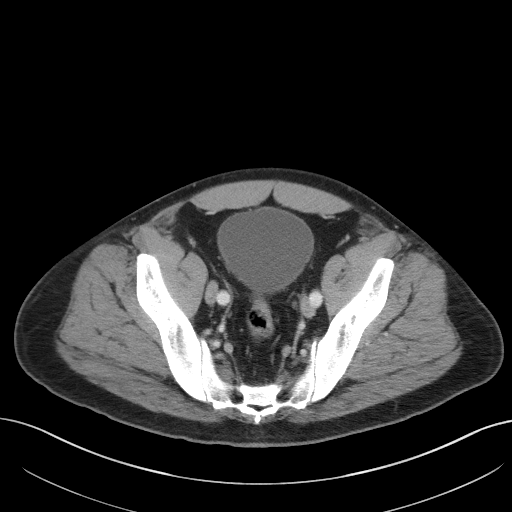
[im 34/101  soft-tissue]
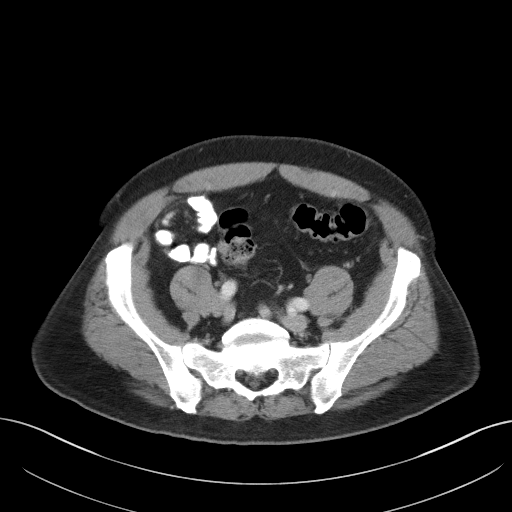
[im 47/101  soft-tissue]
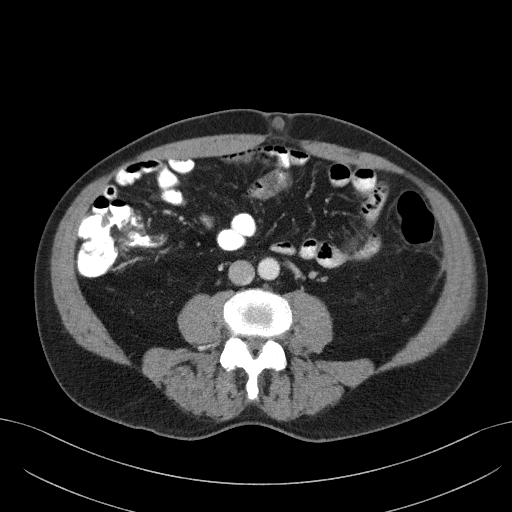
[im 54/101  soft-tissue]
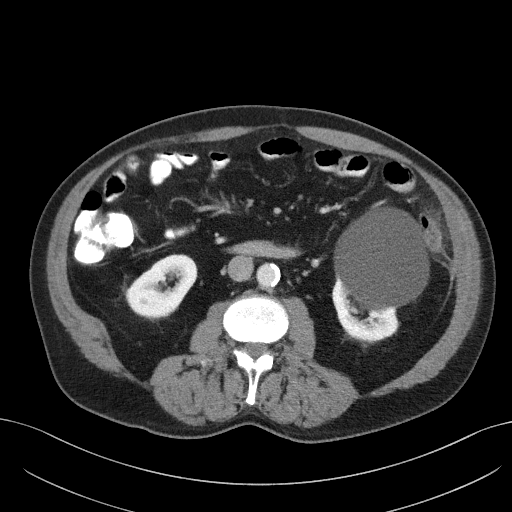
[im 67/101  soft-tissue]
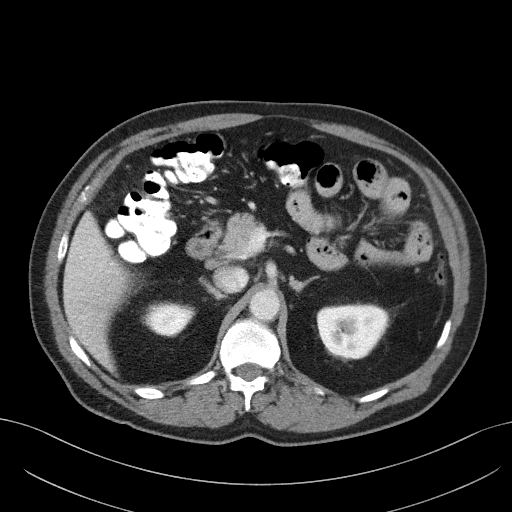
[im 74/101  soft-tissue]
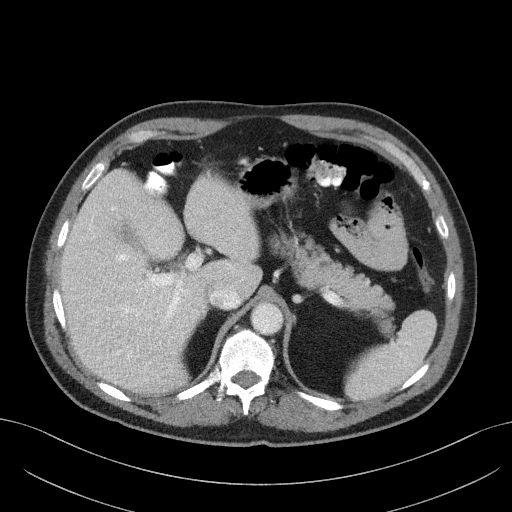
[im 81/101  soft-tissue]
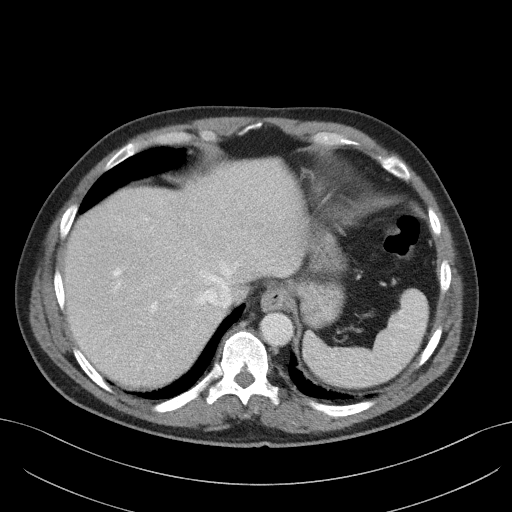
[im 81/101  bone]
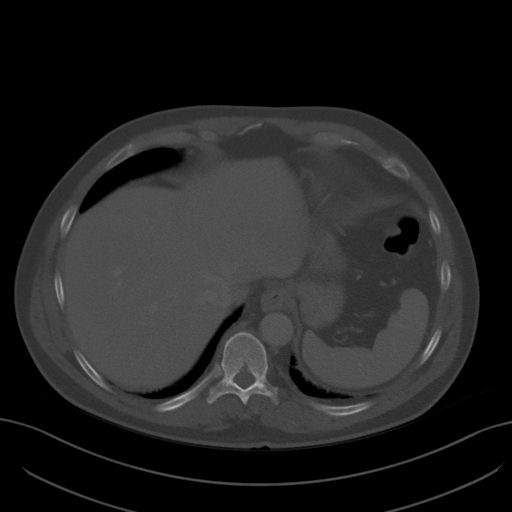
[im 94/101  soft-tissue]
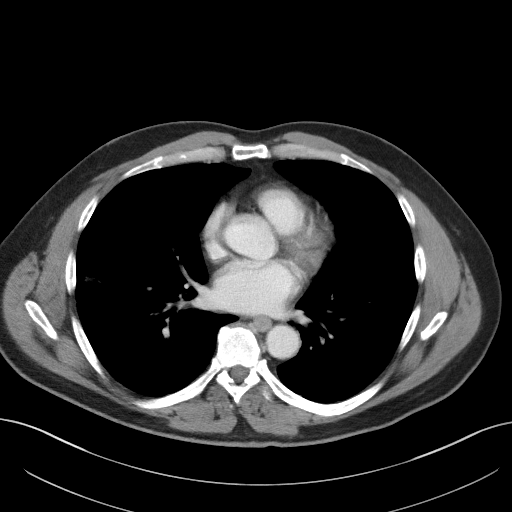

[Series 6: coronal st · coronal · 0.75mm/px · 3 of 101 slices shown]
[im 34/101  soft-tissue]
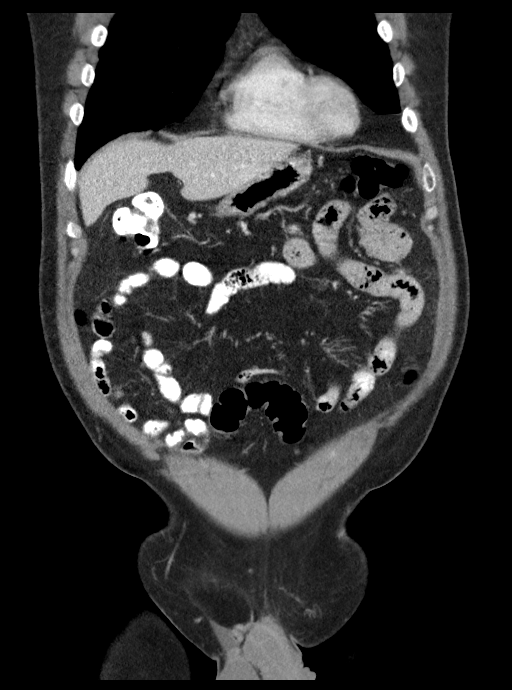
[im 45/101  soft-tissue]
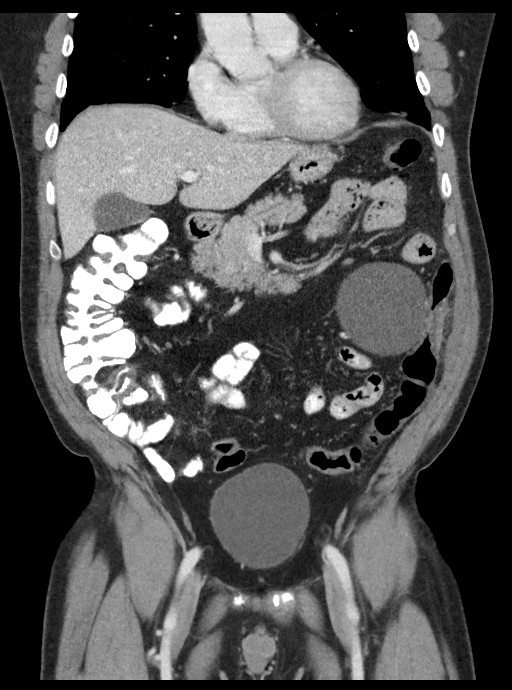
[im 56/101  soft-tissue]
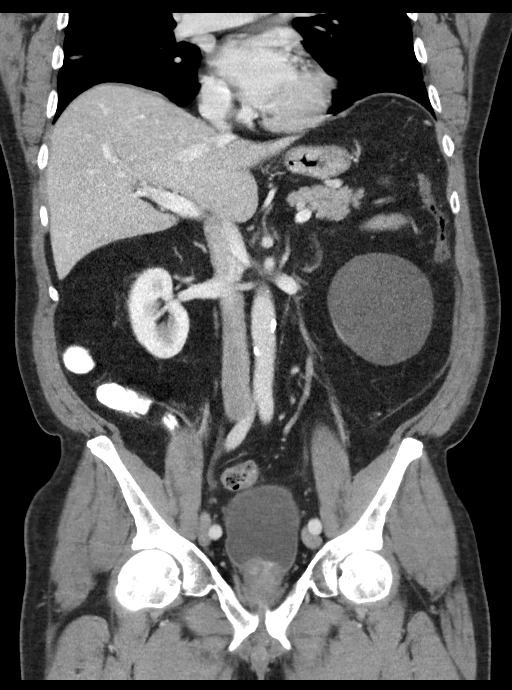

[13 of 46 positions shown; findings below may reference images not displayed]

FINDINGS: Lower chest: Lung bases are clear. Normal heart size. No pericardial
effusion.

Hepatobiliary: No worrisome focal liver lesions. Smooth liver
surface contour. Normal hepatic attenuation. Normal gallbladder and
biliary tree without visible calcified gallstone.

Pancreas: No pancreatic ductal dilatation or surrounding
inflammatory changes.

Spleen: Normal in size. No concerning splenic lesions.

Adrenals/Urinary Tract: Normal adrenal glands. Kidneys enhance
symmetrically and uniformly. Symmetric excretion as well. Mild
symmetric bilateral perinephric stranding, a nonspecific finding.
Simple appearing fluid attenuation cyst in the upper pole right
kidney measuring 2 cm. A larger partially exophytic 8.1 by 8.2 cm
fluid attenuation cyst is seen arising from the interpolar left
kidney with some thin mural calcifications but no other significant
internal complexity. A slightly more conspicuous solid nodule with
possible enhancement is seen arising from the lower pole right
kidney measuring by approximately 1 x 1 x 1.2 cm ([DATE], 2/45). No
other concerning renal mass. No urolithiasis or hydronephrosis.
Urinary bladder is unremarkable aside from indentation of the
bladder base by an enlarged prostate with median lobe hypertrophy.

Stomach/Bowel: Small sliding-type hiatal hernia. Stomach and
duodenum are unremarkable. No small bowel thickening or dilatation.
No proximal colonic dilatation or inflammatory change. There is
however some focal asymmetric thickening and inflammatory phlegmon
centered upon a short segment of narrowed descending colon (2/50).
Adjacent stranding and thickening of the peritoneal surface. No
convincing evidence of resulting obstruction is seen at this level.
More distal colon with a more normal appearance.

Vascular/Lymphatic: Atherosclerotic calcifications within the
abdominal aorta and branch vessels. No aneurysm or ectasia. No
enlarged abdominopelvic lymph nodes.

Reproductive: Prostatomegaly. Heterogeneous enhancement of the
prostate is nonspecific. Few typically benign prostate
calcifications are present. Seminal vesicles are unremarkable. No
acute abnormality included external genitalia.

Other: Tiny fat containing umbilical hernia with some small amount
of central intermediate attenuation, could reflect fat strangulation
or reactive free fluid within the hernia sac (2/56). Fat containing
right inguinal hernia without inflammation. No bowel containing
hernia. Inflammatory changes and peritoneal thickening adjacent
thickened descending colon, as above.

Musculoskeletal: Some asymmetric sclerosis of the left pubic body
when compared to the contralateral side, could correlate for sequela
of prior infection, trauma or osteitis pubis. Partially sacralized
right L5 transverse process. Mild levocurvature of the lumbar spine,
apex L3. Multilevel degenerative changes are present in the imaged
portions of the spine. VUMAZONKE pit in the right femoral neck. No
other acute or concerning osseous abnormalities.
IMPRESSION: 1. There is some focal asymmetric thickening and inflammatory
phlegmon centered upon a short segment of narrowed descending colon.
Adjacent stranding and thickening of the peritoneal surface. No
convincing evidence of resulting obstruction is seen at this level.
While such findings could reflect a focal colitis of infectious or
inflammatory etiology, given luminal narrowing and slightly
asymmetric thickening, certainly warrants further evaluation with
direct visualization.
2. Somewhat conspicuous solid right renal mass measuring up to
cm in size arising from the lower pole right kidney. Recommend
further evaluation with contrast-enhanced MRI.
3. Tiny fat containing umbilical hernia with some small amount of
central intermediate attenuation, could reflect fat strangulation or
reactive free fluid within the hernia sac. Correlate for point
tenderness.
4. Prostatomegaly with median lobe hypertrophy. Heterogeneous
enhancement of the prostate is nonspecific. Recommend correlation
with clinical prostate exam and PSA as warranted. Additionally,
there is indentation of the bladder base, consider assessment of
obstructive symptoms.
5. Some asymmetric sclerosis of the left pubic body when compared to
the contralateral side, could correlate for sequela of prior
infection, trauma or osteitis pubis, though do recommend continued
attention on follow-up imaging.
6. Aortic Atherosclerosis ([N2]-[N2]).

These results will be called to the ordering clinician or
representative by the Radiologist Assistant, and communication
documented in the PACS or [REDACTED].

## 2020-05-29 MED ORDER — IOHEXOL 300 MG/ML  SOLN
100.0000 mL | Freq: Once | INTRAMUSCULAR | Status: AC | PRN
  Administered 2020-05-29: 100 mL via INTRAVENOUS

## 2020-06-03 DIAGNOSIS — Z86010 Personal history of colonic polyps: Secondary | ICD-10-CM | POA: Diagnosis not present

## 2020-06-03 DIAGNOSIS — N281 Cyst of kidney, acquired: Secondary | ICD-10-CM | POA: Diagnosis not present

## 2020-06-03 DIAGNOSIS — K5792 Diverticulitis of intestine, part unspecified, without perforation or abscess without bleeding: Secondary | ICD-10-CM | POA: Diagnosis not present

## 2020-06-03 DIAGNOSIS — N2889 Other specified disorders of kidney and ureter: Secondary | ICD-10-CM | POA: Diagnosis not present

## 2020-06-03 DIAGNOSIS — D49511 Neoplasm of unspecified behavior of right kidney: Secondary | ICD-10-CM | POA: Diagnosis not present

## 2020-06-03 DIAGNOSIS — R935 Abnormal findings on diagnostic imaging of other abdominal regions, including retroperitoneum: Secondary | ICD-10-CM | POA: Diagnosis not present

## 2020-06-07 ENCOUNTER — Other Ambulatory Visit: Payer: Self-pay | Admitting: Urology

## 2020-06-07 ENCOUNTER — Other Ambulatory Visit (HOSPITAL_COMMUNITY): Payer: Self-pay | Admitting: Urology

## 2020-06-07 DIAGNOSIS — D49511 Neoplasm of unspecified behavior of right kidney: Secondary | ICD-10-CM

## 2020-06-12 ENCOUNTER — Ambulatory Visit (HOSPITAL_COMMUNITY)
Admission: RE | Admit: 2020-06-12 | Discharge: 2020-06-12 | Disposition: A | Discharge status: Home / Self Care | Payer: BC Managed Care – PPO | Source: Ambulatory Visit | Attending: Urology | Admitting: Urology

## 2020-06-12 DIAGNOSIS — K862 Cyst of pancreas: Secondary | ICD-10-CM | POA: Diagnosis not present

## 2020-06-12 DIAGNOSIS — D49511 Neoplasm of unspecified behavior of right kidney: Secondary | ICD-10-CM | POA: Insufficient documentation

## 2020-06-12 DIAGNOSIS — N281 Cyst of kidney, acquired: Secondary | ICD-10-CM | POA: Diagnosis not present

## 2020-06-12 DIAGNOSIS — K7689 Other specified diseases of liver: Secondary | ICD-10-CM | POA: Diagnosis not present

## 2020-06-12 DIAGNOSIS — I700 Atherosclerosis of aorta: Secondary | ICD-10-CM | POA: Diagnosis not present

## 2020-06-12 IMAGING — MR MR ABDOMEN WO/W CM
18 series · 48 of 48 positions shown · IV contrast (gadavist)
Comparison: CT [DATE]

CLINICAL DATA: Evaluate suspicious lesion arising off the lower
pole of right kidney.

EXAM:
MRI ABDOMEN WITHOUT AND WITH CONTRAST
TECHNIQUE: Multiplanar multisequence MR imaging of the abdomen was performed
both before and after the administration of intravenous contrast.
CONTRAST:  8mL GADAVIST GADOBUTROL 1 MMOL/ML IV SOLN

[Series 3: T2 fat-sat · axial · 6.0mm · 1.25mm/px · z∈[-137,+115]mm · 2 of 36 slices shown]
[im 1/36]
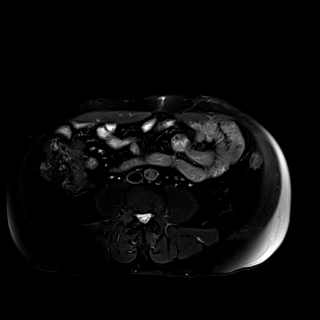
[im 36/36]
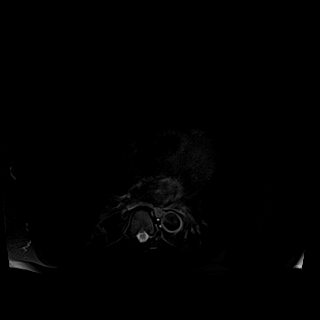

[Series 4: T2 · coronal · 6.0mm · 1.56mm/px · 2 of 30 slices shown (1 of 2)]
[im 1/30]
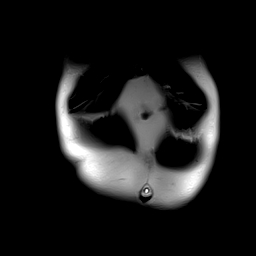
[im 30/30]
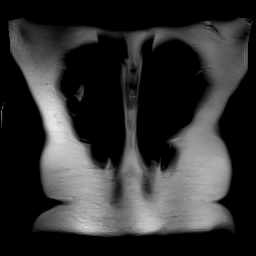

[Series 5: T1 · axial · 3.0mm · 1.25mm/px · z∈[-172,+41]mm · 3 of 72 slices shown (1 of 2)]
[im 1/72]
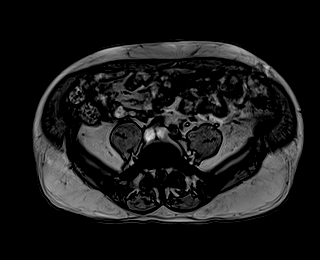
[im 36/72]
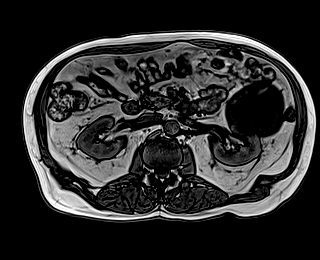
[im 72/72]
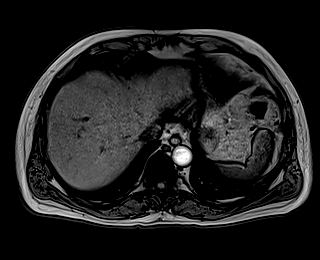

[Series 6: T1 · axial · 3.0mm · 1.25mm/px · z∈[-172,+41]mm · 3 of 72 slices shown (2 of 2)]
[im 1/72]
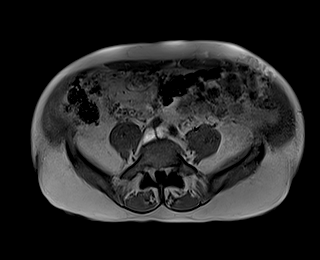
[im 36/72]
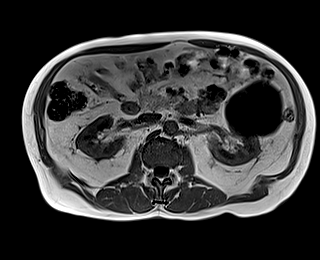
[im 72/72]
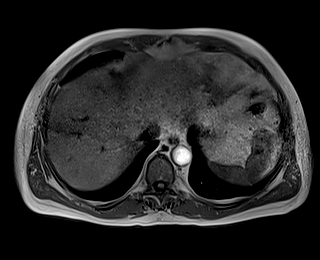

[Series 7: DWI · axial · 6.0mm · 1.49mm/px · z∈[-166,+86]mm · 3 of 72 slices shown (1 of 2)]
[im 1/72]
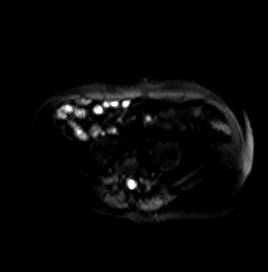
[im 36/72]
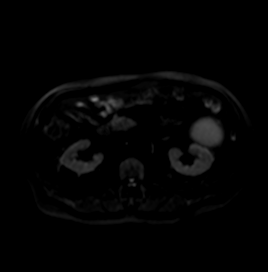
[im 72/72]
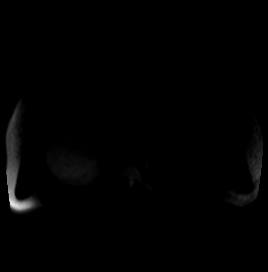

[Series 8: DWI · axial · 6.0mm · 1.49mm/px · 1 of 36 slices shown (2 of 2)]
[im 1/36]
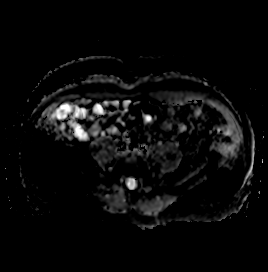

[Series 9: bSSFP · axial · 5.0mm · 0.84mm/px · z∈[-175,+94]mm · 2 of 50 slices shown]
[im 1/50]
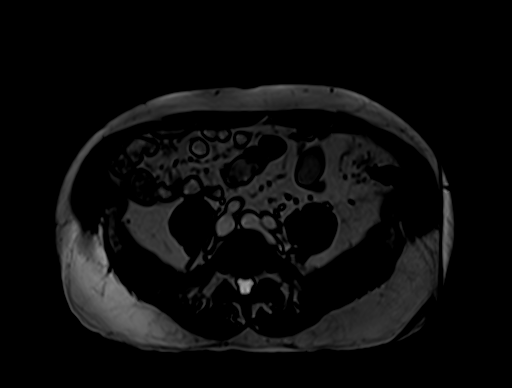
[im 50/50]
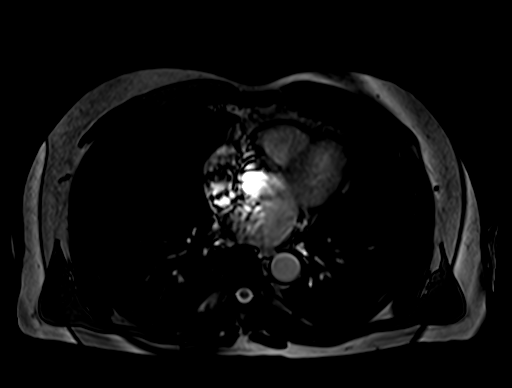

[Series 11: T1 dynamic · axial · 3.0mm · 1.25mm/px · z∈[-172,+65]mm · 3 of 80 slices shown (1 of 10)]
[im 1/80]
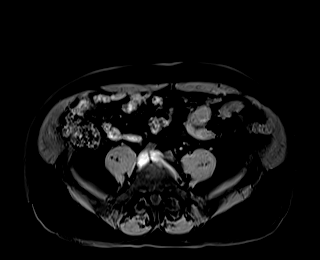
[im 40/80]
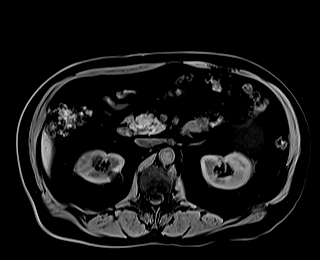
[im 80/80]
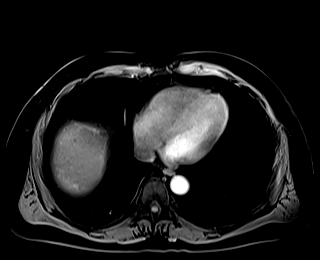

[Series 15: T1 dynamic · axial · 3.0mm · 1.25mm/px · z∈[-172,+65]mm · 3 of 80 slices shown (2 of 10)]
[im 1/80]
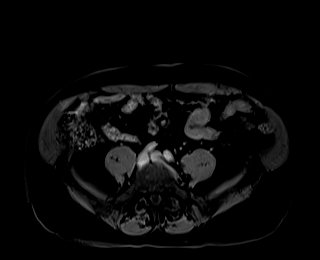
[im 40/80]
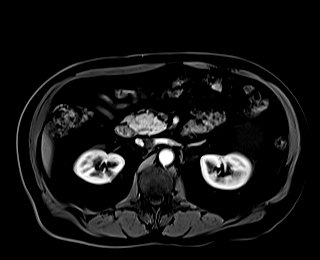
[im 80/80]
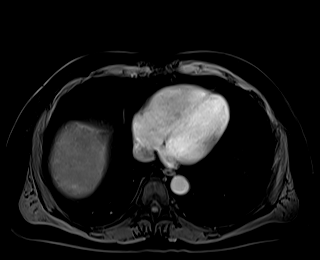

[Series 16: T1 dynamic · axial · 3.0mm · 1.25mm/px · z∈[-172,+65]mm · 3 of 80 slices shown (3 of 10)]
[im 1/80]
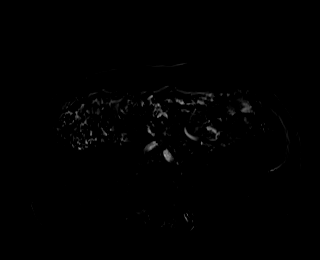
[im 40/80]
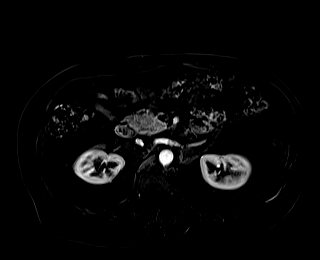
[im 80/80]
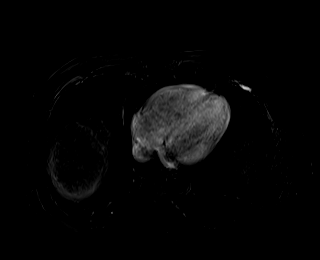

[Series 19: T1 dynamic · axial · 3.0mm · 1.25mm/px · z∈[-172,+65]mm · 3 of 80 slices shown (4 of 10)]
[im 1/80]
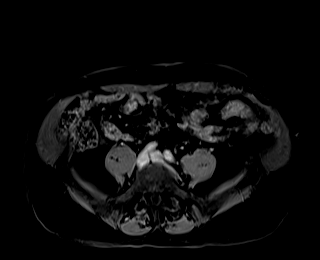
[im 40/80]
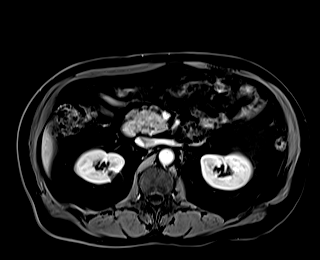
[im 80/80]
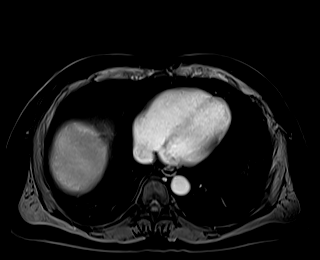

[Series 20: T1 dynamic · axial · 3.0mm · 1.25mm/px · z∈[-172,+65]mm · 3 of 80 slices shown (5 of 10)]
[im 1/80]
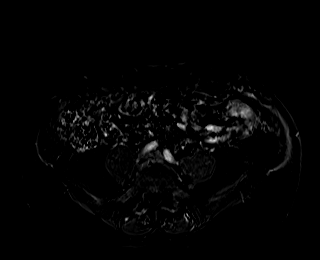
[im 40/80]
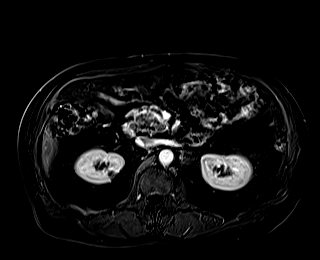
[im 80/80]
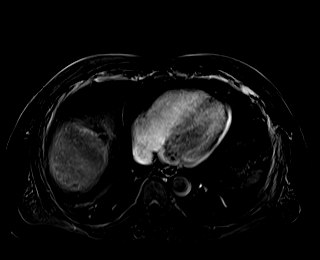

[Series 23: T1 dynamic · axial · 3.0mm · 1.25mm/px · z∈[-172,+65]mm · 3 of 80 slices shown (6 of 10)]
[im 1/80]
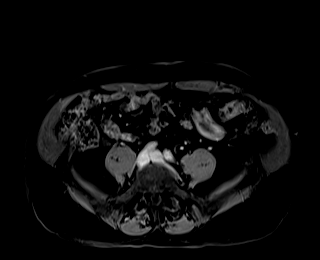
[im 40/80]
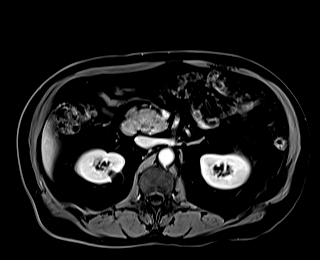
[im 80/80]
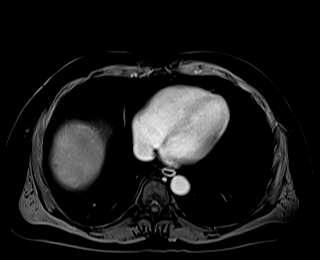

[Series 24: T1 dynamic · axial · 3.0mm · 1.25mm/px · z∈[-172,+65]mm · 3 of 80 slices shown (7 of 10)]
[im 1/80]
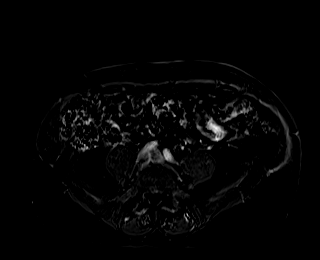
[im 40/80]
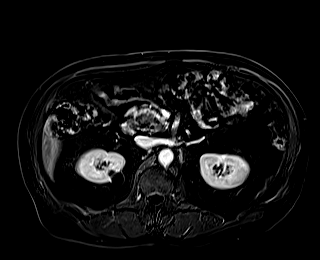
[im 80/80]
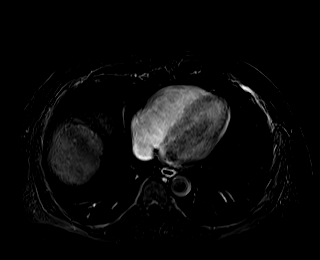

[Series 27: T2 · axial · 6.0mm · 1.56mm/px · z∈[-246,+78]mm · 2 of 46 slices shown (2 of 2)]
[im 1/46]
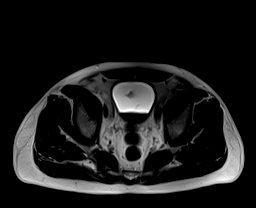
[im 46/46]
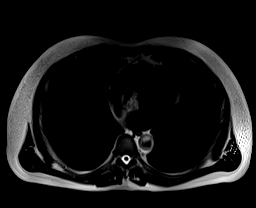

[Series 30: T1 dynamic · axial · 3.0mm · 1.25mm/px · z∈[-172,+65]mm · 3 of 80 slices shown (8 of 10)]
[im 1/80]
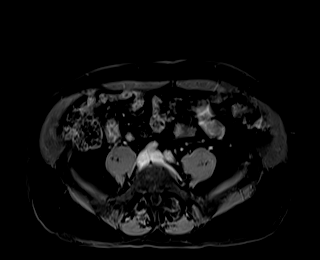
[im 40/80]
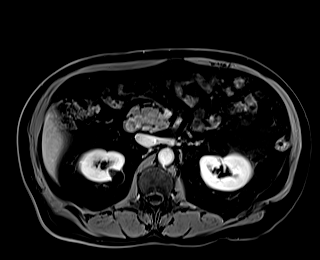
[im 80/80]
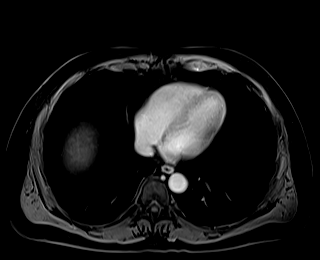

[Series 31: T1 dynamic · axial · 3.0mm · 1.25mm/px · z∈[-172,+65]mm · 3 of 80 slices shown (9 of 10)]
[im 1/80]
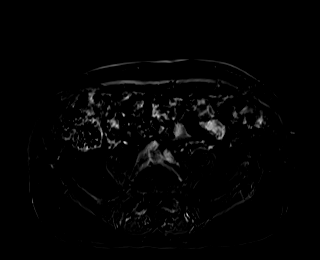
[im 40/80]
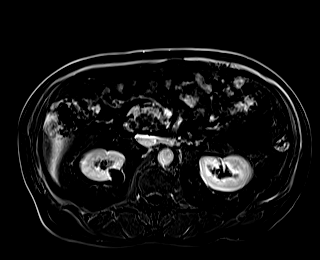
[im 80/80]
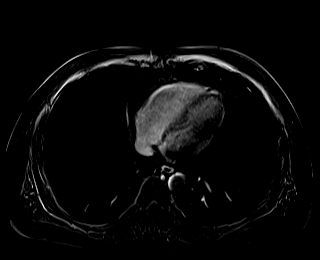

[Series 33: T1 dynamic · coronal · 3.0mm · 1.41mm/px · 3 of 72 slices shown (10 of 10)]
[im 1/72]
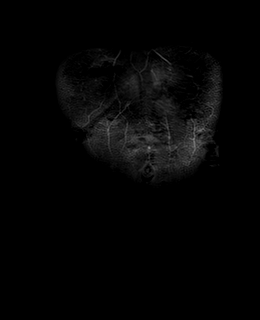
[im 36/72]
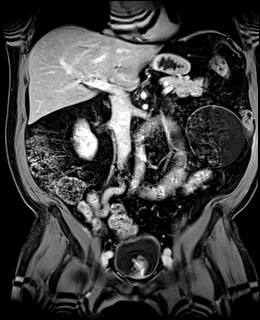
[im 72/72]
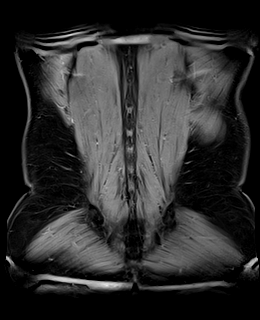

[48 of 48 positions shown; findings below may reference images not displayed]

FINDINGS: Lower chest: No acute abnormality.

Hepatobiliary: A few tiny cysts are identified within the right
hepatic lobe. Gallbladder appears normal. No gallstones or signs of
gallbladder wall thickening. No bile duct dilatation.

Pancreas: No pancreatic inflammation or main duct dilatation.
Unilocular cystic lesion within tail of pancreas measures 9 mm,
image [DATE].

Spleen:  Within normal limits in size and appearance.

Adrenals/Urinary Tract: Normal appearance of the adrenal glands.
There is a large cyst arising off the upper pole of the left kidney
measuring 8 cm, image 52/9. No internal septation or enhancing mural
nodule.

Corresponding to the CT abnormality is 1.1 cm subcapsular lesion
arising from the posterolateral aspect of the right inferior pole.
This is T2 hyperintense and T1 hypointense with diffuse internal
enhancement, image 52/2 and coronal image 48/3.

Simple appearing cyst arises from the medial cortex of the upper
pole of right kidney measuring 1.6 cm.

No hydronephrosis identified bilaterally.

Stomach/Bowel: Visualized portions within the abdomen are
unremarkable.

Vascular/Lymphatic: Patent vasculature aortic atherosclerosis. No
aneurysm. No signs of abdominal adenopathy.

Other:  No free fluid or fluid collections.

Musculoskeletal: No suspicious bone lesions identified.
IMPRESSION: 1. Enhancing lesion arising from the posterolateral aspect of the
right inferior pole is compatible with a small renal cell carcinoma.
2. Bilateral Bosniak category 1 cysts are noted.
3. Unilocular cystic lesion within the tail of pancreas measuring 9
mm. This is favored to represent a benign process. Follow-up imaging
with repeat MRI in 12 months is recommended. This recommendation
follows ACR consensus guidelines: Management of Incidental
Pancreatic Cysts: A White Paper of the ACR Incidental Findings
Committee. [HOSPITAL] [HG];[DATE].

## 2020-06-12 MED ORDER — GADOBUTROL 1 MMOL/ML IV SOLN
8.0000 mL | Freq: Once | INTRAVENOUS | Status: AC | PRN
  Administered 2020-06-12: 8 mL via INTRAVENOUS

## 2020-06-18 DIAGNOSIS — J3081 Allergic rhinitis due to animal (cat) (dog) hair and dander: Secondary | ICD-10-CM | POA: Diagnosis not present

## 2020-06-18 DIAGNOSIS — J3089 Other allergic rhinitis: Secondary | ICD-10-CM | POA: Diagnosis not present

## 2020-06-18 DIAGNOSIS — J301 Allergic rhinitis due to pollen: Secondary | ICD-10-CM | POA: Diagnosis not present

## 2020-07-09 DIAGNOSIS — J3081 Allergic rhinitis due to animal (cat) (dog) hair and dander: Secondary | ICD-10-CM | POA: Diagnosis not present

## 2020-07-09 DIAGNOSIS — J3089 Other allergic rhinitis: Secondary | ICD-10-CM | POA: Diagnosis not present

## 2020-07-09 DIAGNOSIS — J301 Allergic rhinitis due to pollen: Secondary | ICD-10-CM | POA: Diagnosis not present

## 2020-07-11 DIAGNOSIS — D3000 Benign neoplasm of unspecified kidney: Secondary | ICD-10-CM | POA: Diagnosis not present

## 2020-07-16 DIAGNOSIS — D2272 Melanocytic nevi of left lower limb, including hip: Secondary | ICD-10-CM | POA: Diagnosis not present

## 2020-07-16 DIAGNOSIS — D2261 Melanocytic nevi of right upper limb, including shoulder: Secondary | ICD-10-CM | POA: Diagnosis not present

## 2020-07-16 DIAGNOSIS — L111 Transient acantholytic dermatosis [Grover]: Secondary | ICD-10-CM | POA: Diagnosis not present

## 2020-07-16 DIAGNOSIS — L814 Other melanin hyperpigmentation: Secondary | ICD-10-CM | POA: Diagnosis not present

## 2020-07-16 DIAGNOSIS — L570 Actinic keratosis: Secondary | ICD-10-CM | POA: Diagnosis not present

## 2020-07-16 DIAGNOSIS — D485 Neoplasm of uncertain behavior of skin: Secondary | ICD-10-CM | POA: Diagnosis not present

## 2020-07-16 DIAGNOSIS — L821 Other seborrheic keratosis: Secondary | ICD-10-CM | POA: Diagnosis not present

## 2020-07-18 ENCOUNTER — Other Ambulatory Visit: Payer: Self-pay | Admitting: Urology

## 2020-07-22 DIAGNOSIS — Z01812 Encounter for preprocedural laboratory examination: Secondary | ICD-10-CM | POA: Diagnosis not present

## 2020-07-23 DIAGNOSIS — R933 Abnormal findings on diagnostic imaging of other parts of digestive tract: Secondary | ICD-10-CM | POA: Diagnosis not present

## 2020-07-23 DIAGNOSIS — Z86010 Personal history of colonic polyps: Secondary | ICD-10-CM | POA: Diagnosis not present

## 2020-07-23 DIAGNOSIS — K5730 Diverticulosis of large intestine without perforation or abscess without bleeding: Secondary | ICD-10-CM | POA: Diagnosis not present

## 2020-07-23 DIAGNOSIS — K648 Other hemorrhoids: Secondary | ICD-10-CM | POA: Diagnosis not present

## 2020-07-30 DIAGNOSIS — D3000 Benign neoplasm of unspecified kidney: Secondary | ICD-10-CM | POA: Diagnosis not present

## 2020-07-30 DIAGNOSIS — J301 Allergic rhinitis due to pollen: Secondary | ICD-10-CM | POA: Diagnosis not present

## 2020-07-30 DIAGNOSIS — J3089 Other allergic rhinitis: Secondary | ICD-10-CM | POA: Diagnosis not present

## 2020-07-30 DIAGNOSIS — J3081 Allergic rhinitis due to animal (cat) (dog) hair and dander: Secondary | ICD-10-CM | POA: Diagnosis not present

## 2020-08-27 DIAGNOSIS — J3089 Other allergic rhinitis: Secondary | ICD-10-CM | POA: Diagnosis not present

## 2020-08-27 DIAGNOSIS — J301 Allergic rhinitis due to pollen: Secondary | ICD-10-CM | POA: Diagnosis not present

## 2020-08-27 DIAGNOSIS — J3081 Allergic rhinitis due to animal (cat) (dog) hair and dander: Secondary | ICD-10-CM | POA: Diagnosis not present

## 2020-09-12 NOTE — Progress Notes (Signed)
DUE TO COVID-19 ONLY ONE VISITOR IS ALLOWED TO COME WITH YOU AND STAY IN THE WAITING ROOM ONLY DURING PRE OP AND PROCEDURE DAY OF SURGERY. THE 1 VISITOR  MAY VISIT WITH YOU AFTER SURGERY IN YOUR PRIVATE ROOM DURING VISITING HOURS ONLY!  YOU NEED TO HAVE A COVID 19 TEST ON__  09/16/20 _____ @_______ , THIS TEST MUST BE DONE BEFORE SURGERY,  COVID TESTING SITE [place] [id number], IT IS ON THE RIGHT GOING OUT [address] APPROXIMATELY  2 MINUTES PAST [employer] ON THE RIGHT. ONCE YOUR COVID TEST IS COMPLETED,  PLEASE BEGIN THE QUARANTINE INSTRUCTIONS AS OUTLINED IN YOUR HANDOUT.                RONDEL EPISCOPO  09/12/2020   Your procedure is scheduled on:                 09/19/20   Report to [facility] Main  Entrance   Report to admitting at       [phone]     Call this number if you have problems the morning of surgery [phone]    Remember: Do not eat food , candy gum or mints :After Midnight. You may have clear liquids from midnight until   0420am   CLEAR LIQUID DIET   Foods Allowed                                                                       Coffee and tea, regular and decaf                              Plain Jell-O any favor except red or purple                                            Fruit ices (not with fruit pulp)                                      Iced Popsicles                                     Carbonated beverages, regular and diet                                    Cranberry, grape and apple juices Sports drinks like Gatorade Lightly seasoned clear broth or consume(fat free) Sugar, honey syrup   _____________________________________________________________________    BRUSH YOUR TEETH MORNING OF SURGERY AND RINSE YOUR MOUTH OUT, NO CHEWING GUM CANDY OR MINTS.     Take these medicines the morning of surgery with A SIP OF WATER:   [place] DO NOT TAKE ANY DIABETIC MEDICATIONS DAY OF YOUR SURGERY  You may not have any metal on your body including hair pins and              piercings  Do not wear jewelry, make-up, lotions, powders or perfumes, deodorant             Do not wear nail polish on your fingernails.  Do not shave  48 hours prior to surgery.              Men may shave face and neck.   Do not bring valuables to the hospital. [place].  Contacts, dentures or bridgework may not be worn into surgery.  Leave suitcase in the car. After surgery it may be brought to your room.     Patients discharged the day of surgery will not be allowed to drive home. IF YOU ARE HAVING SURGERY AND GOING HOME THE SAME DAY, YOU MUST HAVE AN ADULT TO DRIVE YOU HOME AND BE WITH YOU FOR 24 HOURS. YOU MAY GO HOME BY TAXI OR UBER OR ORTHERWISE, BUT AN ADULT MUST ACCOMPANY YOU HOME AND STAY WITH YOU FOR 24 HOURS.  Name and phone number of your driver:  Special Instructions: N/A              Please read over the following fact sheets you were given: _____________________________________________________________________  [facility] - Preparing for Surgery Before surgery, you can play an important role.  Because skin is not sterile, your skin needs to be as free of germs as possible.  You can reduce the number of germs on your skin by washing with CHG (chlorahexidine gluconate) soap before surgery.  CHG is an antiseptic cleaner which kills germs and bonds with the skin to continue killing germs even after washing. Please DO NOT use if you have an allergy to CHG or antibacterial soaps.  If your skin becomes reddened/irritated stop using the CHG and inform your nurse when you arrive at Short Stay. Do not shave (including legs and underarms) for at least 48 hours prior to the first CHG shower.  You may shave your face/neck. Please follow these instructions carefully:  1.  Shower with CHG Soap the night before surgery and the  morning of  Surgery.  2.  If you choose to wash your hair, wash your hair first as usual with your  normal  shampoo.  3.  After you shampoo, rinse your hair and body thoroughly to remove the  shampoo.                           4.  Use CHG as you would any other liquid soap.  You can apply chg directly  to the skin and wash                       Gently with a scrungie or clean washcloth.  5.  Apply the CHG Soap to your body ONLY FROM THE NECK DOWN.   Do not use on face/ open                           Wound or open sores. Avoid contact with eyes, ears mouth and genitals (private parts).  Wash face,  Genitals (private parts) with your normal soap.             6.  Wash thoroughly, paying special attention to the area where your surgery  will be performed.  7.  Thoroughly rinse your body with warm water from the neck down.  8.  DO NOT shower/wash with your normal soap after using and rinsing off  the CHG Soap.                9.  Pat yourself dry with a clean towel.            10.  Wear clean pajamas.            11.  Place clean sheets on your bed the night of your first shower and do not  sleep with pets. Day of Surgery : Do not apply any lotions/deodorants the morning of surgery.  Please wear clean clothes to the hospital/surgery center.  FAILURE TO FOLLOW THESE INSTRUCTIONS MAY RESULT IN THE CANCELLATION OF YOUR SURGERY PATIENT SIGNATURE_________________________________  NURSE SIGNATURE__________________________________  ________________________________________________________________________

## 2020-09-13 ENCOUNTER — Other Ambulatory Visit: Payer: Self-pay

## 2020-09-13 ENCOUNTER — Encounter (HOSPITAL_COMMUNITY)
Admission: RE | Admit: 2020-09-13 | Discharge: 2020-09-13 | Disposition: A | Discharge status: Home / Self Care | Payer: BC Managed Care – PPO | Source: Ambulatory Visit | Attending: Internal Medicine | Admitting: Internal Medicine

## 2020-09-13 ENCOUNTER — Encounter (HOSPITAL_COMMUNITY): Payer: Self-pay

## 2020-09-13 HISTORY — DX: Malignant (primary) neoplasm, unspecified: C80.1

## 2020-09-13 NOTE — Progress Notes (Addendum)
Anesthesia Review:  PCP: DR Domenick Gong  Requested LOV note on 09/13/20  Have requested documentation regarding phone call on 09/12/20 regarding positive home covid test on 09/07/20 and 09/11/20.  Office to fax.  Note on chart  along with LOV 11/14/19 on chart  Cardiologist : none  Chest x-ray : EKG : to be done am of surgery  Echo : Stress test: Cardiac Cath :  Activity level: can do a flight of stairs withoiut difficulty  Sleep Study/ CPAP : none  Fasting Blood Sugar :      / Checks Blood Sugar -- times a day:   Blood Thinner/ Instructions /Last Dose: ASA / Instructions/ Last Dose :   Aspirin- stop 5 days prior per pt  Pt reports home test positive for covid on 09/07/20 and 09/11/20.  Pt made aware on 09/12/20 to contact PCP and Dr Louis Meckel office in regards to this.  On 09/13/20 preop medical hx and instructions were done via phone.  Pt reports no covid symptoms at time of phone call.  Pt stated that he called Dr Louis Meckel office and Dr Osborne Casco office on 09/12/20.  PT is still currenly on OR schedule.  PT aware labs, etc to be done am of surgery per [facility] Chart Room Nurse.

## 2020-09-16 NOTE — Progress Notes (Signed)
Patient called today to see if he should get his allergy shots he gets routinely on 09/17/20.  Informed pt that he should call his surgeon office.  PT voiced understanding.

## 2020-09-19 ENCOUNTER — Observation Stay (HOSPITAL_COMMUNITY)
Admission: RE | Admit: 2020-09-19 | Discharge: 2020-09-20 | Disposition: A | Discharge status: Home / Self Care | Payer: BC Managed Care – PPO | Attending: Urology | Admitting: Urology

## 2020-09-19 ENCOUNTER — Encounter (HOSPITAL_COMMUNITY): Payer: Self-pay | Admitting: Urology

## 2020-09-19 ENCOUNTER — Ambulatory Visit (HOSPITAL_COMMUNITY): Payer: BC Managed Care – PPO | Admitting: Certified Registered Nurse Anesthetist

## 2020-09-19 ENCOUNTER — Ambulatory Visit (HOSPITAL_COMMUNITY): Payer: BC Managed Care – PPO | Admitting: Physician Assistant

## 2020-09-19 ENCOUNTER — Other Ambulatory Visit: Payer: Self-pay

## 2020-09-19 ENCOUNTER — Encounter (HOSPITAL_COMMUNITY)
Admission: RE | Disposition: A | Discharge status: Home / Self Care | Payer: Self-pay | Source: Home / Self Care | Attending: Urology

## 2020-09-19 DIAGNOSIS — N400 Enlarged prostate without lower urinary tract symptoms: Secondary | ICD-10-CM | POA: Diagnosis not present

## 2020-09-19 DIAGNOSIS — I10 Essential (primary) hypertension: Secondary | ICD-10-CM | POA: Insufficient documentation

## 2020-09-19 DIAGNOSIS — Z85828 Personal history of other malignant neoplasm of skin: Secondary | ICD-10-CM | POA: Diagnosis not present

## 2020-09-19 DIAGNOSIS — N2889 Other specified disorders of kidney and ureter: Secondary | ICD-10-CM | POA: Diagnosis not present

## 2020-09-19 DIAGNOSIS — Z7982 Long term (current) use of aspirin: Secondary | ICD-10-CM | POA: Diagnosis not present

## 2020-09-19 DIAGNOSIS — C641 Malignant neoplasm of right kidney, except renal pelvis: Principal | ICD-10-CM | POA: Insufficient documentation

## 2020-09-19 DIAGNOSIS — J309 Allergic rhinitis, unspecified: Secondary | ICD-10-CM | POA: Diagnosis not present

## 2020-09-19 HISTORY — PX: ROBOTIC ASSITED PARTIAL NEPHRECTOMY: SHX6087

## 2020-09-19 LAB — BASIC METABOLIC PANEL
Anion gap: 7 (ref 5–15)
BUN: 17 mg/dL (ref 8–23)
CO2: 28 mmol/L (ref 22–32)
Calcium: 9.4 mg/dL (ref 8.9–10.3)
Chloride: 102 mmol/L (ref 98–111)
Creatinine, Ser: 1.23 mg/dL (ref 0.61–1.24)
GFR, Estimated: >60 mL/min (ref >60)
Glucose, Bld: 93 mg/dL (ref 70–99)
Potassium: 3.6 mmol/L (ref 3.5–5.1)
Sodium: 137 mmol/L (ref 135–145)

## 2020-09-19 LAB — CBC
HCT: 49.8 % (ref 39.0–52.0)
Hemoglobin: 16.6 g/dL (ref 13.0–17.0)
MCH: 28.8 pg (ref 26.0–34.0)
MCHC: 33.3 g/dL (ref 30.0–36.0)
MCV: 86.3 fL (ref 80.0–100.0)
Platelets: 227 10*3/uL (ref 150–400)
RBC: 5.77 MIL/uL (ref 4.22–5.81)
RDW: 12.8 % (ref 11.5–15.5)
WBC: 5.5 10*3/uL (ref 4.0–10.5)
nRBC: 0 % (ref 0.0–0.2)

## 2020-09-19 LAB — TYPE AND SCREEN
ABO/RH(D): A POS
Antibody Screen: NEGATIVE

## 2020-09-19 LAB — HEMOGLOBIN AND HEMATOCRIT, BLOOD
HCT: 48.3 % (ref 39.0–52.0)
Hemoglobin: 16.1 g/dL (ref 13.0–17.0)

## 2020-09-19 SURGERY — NEPHRECTOMY, PARTIAL, ROBOT-ASSISTED
Anesthesia: General | Laterality: Right

## 2020-09-19 MED ORDER — FENTANYL CITRATE (PF) 250 MCG/5ML IJ SOLN
INTRAMUSCULAR | Status: AC
  Filled 2020-09-19: qty 5

## 2020-09-19 MED ORDER — LIDOCAINE HCL (CARDIAC) PF 100 MG/5ML IV SOSY
PREFILLED_SYRINGE | INTRAVENOUS | Status: DC | PRN
  Administered 2020-09-19: 100 mg via INTRAVENOUS

## 2020-09-19 MED ORDER — MORPHINE SULFATE (PF) 2 MG/ML IV SOLN
2.0000 mg | INTRAVENOUS | Status: AC | PRN
  Administered 2020-09-19 – 2020-09-20 (×2): 2 mg via INTRAVENOUS
  Filled 2020-09-19 (×2): qty 1

## 2020-09-19 MED ORDER — CISATRACURIUM BESYLATE 20 MG/10ML IV SOLN
INTRAVENOUS | Status: AC
  Filled 2020-09-19: qty 10

## 2020-09-19 MED ORDER — SODIUM CHLORIDE 0.9 % IV SOLN
INTRAVENOUS | Status: DC | PRN
  Administered 2020-09-19: 50 ug/min via INTRAVENOUS

## 2020-09-19 MED ORDER — CISATRACURIUM BESYLATE (PF) 10 MG/5ML IV SOLN
INTRAVENOUS | Status: DC | PRN
  Administered 2020-09-19: 10 mg via INTRAVENOUS

## 2020-09-19 MED ORDER — ORAL CARE MOUTH RINSE: 15.0000 mL | Freq: Once | OROMUCOSAL | Status: AC

## 2020-09-19 MED ORDER — MIDAZOLAM HCL 5 MG/5ML IJ SOLN
INTRAMUSCULAR | Status: DC | PRN
  Administered 2020-09-19: 2 mg via INTRAVENOUS

## 2020-09-19 MED ORDER — STERILE WATER FOR IRRIGATION IR SOLN
Status: DC | PRN
  Administered 2020-09-19: 1000 mL

## 2020-09-19 MED ORDER — BUPIVACAINE-EPINEPHRINE (PF) 0.5% -1:200000 IJ SOLN
INTRAMUSCULAR | Status: AC
  Filled 2020-09-19: qty 30

## 2020-09-19 MED ORDER — VECURONIUM BROMIDE 10 MG IV SOLR
10.0000 mg | Freq: Once | INTRAVENOUS | Status: DC
  Filled 2020-09-19: qty 10

## 2020-09-19 MED ORDER — PHENYLEPHRINE HCL (PRESSORS) 10 MG/ML IV SOLN
INTRAVENOUS | Status: AC
  Filled 2020-09-19: qty 1

## 2020-09-19 MED ORDER — TRAMADOL HCL 50 MG PO TABS: 50.0000 mg | ORAL_TABLET | Freq: Four times a day (QID) | ORAL | 0 refills | Status: DC | PRN

## 2020-09-19 MED ORDER — AZELASTINE HCL 0.1 % NA SOLN
1.0000 | Freq: Two times a day (BID) | NASAL | Status: DC
  Administered 2020-09-19: 1 via NASAL
  Filled 2020-09-19: qty 30

## 2020-09-19 MED ORDER — BUPIVACAINE LIPOSOME 1.3 % IJ SUSP
20.0000 mL | Freq: Once | INTRAMUSCULAR | Status: AC
  Administered 2020-09-19: 20 mL
  Filled 2020-09-19: qty 20

## 2020-09-19 MED ORDER — PROPOFOL 10 MG/ML IV BOLUS
INTRAVENOUS | Status: AC
  Filled 2020-09-19: qty 40

## 2020-09-19 MED ORDER — ONDANSETRON HCL 4 MG/2ML IJ SOLN: 4.0000 mg | Freq: Once | INTRAMUSCULAR | Status: DC | PRN

## 2020-09-19 MED ORDER — MIDAZOLAM HCL 2 MG/2ML IJ SOLN
INTRAMUSCULAR | Status: AC
  Filled 2020-09-19: qty 2

## 2020-09-19 MED ORDER — LIDOCAINE HCL 2 % IJ SOLN
INTRAMUSCULAR | Status: AC
  Filled 2020-09-19: qty 20

## 2020-09-19 MED ORDER — DOCUSATE SODIUM 100 MG PO CAPS: 100.0000 mg | ORAL_CAPSULE | Freq: Two times a day (BID) | ORAL | Status: DC

## 2020-09-19 MED ORDER — LACTATED RINGERS IV SOLN: INTRAVENOUS | Status: DC | PRN

## 2020-09-19 MED ORDER — ACETAMINOPHEN 500 MG PO TABS
1000.0000 mg | ORAL_TABLET | Freq: Four times a day (QID) | ORAL | Status: AC
  Administered 2020-09-19 – 2020-09-20 (×4): 1000 mg via ORAL
  Filled 2020-09-19 (×4): qty 2

## 2020-09-19 MED ORDER — DEXMEDETOMIDINE (PRECEDEX) IN NS 20 MCG/5ML (4 MCG/ML) IV SYRINGE
PREFILLED_SYRINGE | INTRAVENOUS | Status: DC | PRN
  Administered 2020-09-19: 20 ug via INTRAVENOUS

## 2020-09-19 MED ORDER — HYDROMORPHONE HCL 1 MG/ML IJ SOLN: 0.2500 mg | INTRAMUSCULAR | Status: DC | PRN

## 2020-09-19 MED ORDER — DEXAMETHASONE SODIUM PHOSPHATE 10 MG/ML IJ SOLN
INTRAMUSCULAR | Status: DC | PRN
  Administered 2020-09-19: 10 mg via INTRAVENOUS

## 2020-09-19 MED ORDER — DEXMEDETOMIDINE (PRECEDEX) IN NS 20 MCG/5ML (4 MCG/ML) IV SYRINGE
PREFILLED_SYRINGE | INTRAVENOUS | Status: AC
  Filled 2020-09-19: qty 5

## 2020-09-19 MED ORDER — OXYMETAZOLINE HCL 0.05 % NA SOLN: 1.0000 | Freq: Every evening | NASAL | Status: DC | PRN

## 2020-09-19 MED ORDER — BUPIVACAINE-EPINEPHRINE 0.5% -1:200000 IJ SOLN
INTRAMUSCULAR | Status: DC | PRN
  Administered 2020-09-19: 14 mL

## 2020-09-19 MED ORDER — CHLORHEXIDINE GLUCONATE CLOTH 2 % EX PADS
6.0000 | MEDICATED_PAD | Freq: Every day | CUTANEOUS | Status: DC
  Administered 2020-09-19: 6 via TOPICAL

## 2020-09-19 MED ORDER — FENTANYL CITRATE (PF) 100 MCG/2ML IJ SOLN
INTRAMUSCULAR | Status: AC
  Filled 2020-09-19: qty 2

## 2020-09-19 MED ORDER — FENTANYL CITRATE (PF) 100 MCG/2ML IJ SOLN
INTRAMUSCULAR | Status: DC | PRN
  Administered 2020-09-19: 50 ug via INTRAVENOUS
  Administered 2020-09-19 (×3): 100 ug via INTRAVENOUS

## 2020-09-19 MED ORDER — NEOSTIGMINE METHYLSULFATE 3 MG/3ML IV SOSY
PREFILLED_SYRINGE | INTRAVENOUS | Status: AC
  Filled 2020-09-19: qty 3

## 2020-09-19 MED ORDER — ONDANSETRON HCL 4 MG/2ML IJ SOLN
INTRAMUSCULAR | Status: AC
  Filled 2020-09-19: qty 2

## 2020-09-19 MED ORDER — CEFAZOLIN SODIUM-DEXTROSE 1-4 GM/50ML-% IV SOLN
1.0000 g | Freq: Three times a day (TID) | INTRAVENOUS | Status: AC
  Administered 2020-09-19 (×2): 1 g via INTRAVENOUS
  Filled 2020-09-19 (×2): qty 50

## 2020-09-19 MED ORDER — PHENYLEPHRINE 40 MCG/ML (10ML) SYRINGE FOR IV PUSH (FOR BLOOD PRESSURE SUPPORT)
PREFILLED_SYRINGE | INTRAVENOUS | Status: AC
  Filled 2020-09-19: qty 10

## 2020-09-19 MED ORDER — ROCURONIUM BROMIDE 10 MG/ML (PF) SYRINGE
PREFILLED_SYRINGE | INTRAVENOUS | Status: AC
  Filled 2020-09-19: qty 20

## 2020-09-19 MED ORDER — DEXAMETHASONE SODIUM PHOSPHATE 10 MG/ML IJ SOLN
INTRAMUSCULAR | Status: AC
  Filled 2020-09-19: qty 1

## 2020-09-19 MED ORDER — ROCURONIUM BROMIDE 100 MG/10ML IV SOLN
INTRAVENOUS | Status: DC | PRN
  Administered 2020-09-19: 50 mg via INTRAVENOUS
  Administered 2020-09-19: 80 mg via INTRAVENOUS
  Administered 2020-09-19: 70 mg via INTRAVENOUS
  Administered 2020-09-19: 20 mg via INTRAVENOUS
  Administered 2020-09-19: 30 mg via INTRAVENOUS
  Administered 2020-09-19: 50 mg via INTRAVENOUS

## 2020-09-19 MED ORDER — ACETAMINOPHEN 10 MG/ML IV SOLN: 1000.0000 mg | Freq: Once | INTRAVENOUS | Status: DC | PRN

## 2020-09-19 MED ORDER — ATORVASTATIN CALCIUM 20 MG PO TABS
20.0000 mg | ORAL_TABLET | Freq: Every day | ORAL | Status: DC
  Administered 2020-09-20: 20 mg via ORAL
  Filled 2020-09-19: qty 1

## 2020-09-19 MED ORDER — SUGAMMADEX SODIUM 500 MG/5ML IV SOLN
INTRAVENOUS | Status: DC | PRN
  Administered 2020-09-19 (×2): 200 mg via INTRAVENOUS

## 2020-09-19 MED ORDER — DEXTROSE-NACL 5-0.45 % IV SOLN: INTRAVENOUS | Status: DC

## 2020-09-19 MED ORDER — OXYCODONE HCL 5 MG/5ML PO SOLN: 5.0000 mg | Freq: Once | ORAL | Status: DC | PRN

## 2020-09-19 MED ORDER — LIDOCAINE 2% (20 MG/ML) 5 ML SYRINGE
INTRAMUSCULAR | Status: AC
  Filled 2020-09-19: qty 5

## 2020-09-19 MED ORDER — CEFAZOLIN SODIUM-DEXTROSE 2-4 GM/100ML-% IV SOLN
2.0000 g | INTRAVENOUS | Status: AC
  Administered 2020-09-19: 2 g via INTRAVENOUS
  Filled 2020-09-19: qty 100

## 2020-09-19 MED ORDER — CHLORHEXIDINE GLUCONATE 0.12 % MT SOLN
15.0000 mL | Freq: Once | OROMUCOSAL | Status: AC
  Administered 2020-09-19: 15 mL via OROMUCOSAL

## 2020-09-19 MED ORDER — PROPOFOL 10 MG/ML IV BOLUS
INTRAVENOUS | Status: DC | PRN
  Administered 2020-09-19: 50 mg via INTRAVENOUS
  Administered 2020-09-19: 200 mg via INTRAVENOUS

## 2020-09-19 MED ORDER — ONDANSETRON HCL 4 MG/2ML IJ SOLN
INTRAMUSCULAR | Status: DC | PRN
  Administered 2020-09-19 (×2): 4 mg via INTRAVENOUS

## 2020-09-19 MED ORDER — LACTATED RINGERS IV SOLN: INTRAVENOUS | Status: DC

## 2020-09-19 MED ORDER — LIDOCAINE HCL (PF) 2 % IJ SOLN
INTRAMUSCULAR | Status: DC | PRN
  Administered 2020-09-19: 1.5 mg/kg/h via INTRADERMAL

## 2020-09-19 MED ORDER — TRAMADOL HCL 50 MG PO TABS
50.0000 mg | ORAL_TABLET | Freq: Four times a day (QID) | ORAL | Status: DC | PRN
  Administered 2020-09-19 (×2): 50 mg via ORAL
  Filled 2020-09-19 (×2): qty 1

## 2020-09-19 MED ORDER — LACTATED RINGERS IR SOLN
Status: DC | PRN
  Administered 2020-09-19: 1000 mL

## 2020-09-19 MED ORDER — OXYCODONE HCL 5 MG PO TABS: 5.0000 mg | ORAL_TABLET | Freq: Once | ORAL | Status: DC | PRN

## 2020-09-19 MED ORDER — FLUTICASONE PROPIONATE 50 MCG/ACT NA SUSP
2.0000 | Freq: Every day | NASAL | Status: DC
  Filled 2020-09-19: qty 16

## 2020-09-19 MED ORDER — ROCURONIUM BROMIDE 10 MG/ML (PF) SYRINGE
PREFILLED_SYRINGE | INTRAVENOUS | Status: AC
  Filled 2020-09-19: qty 10

## 2020-09-19 MED ORDER — GLYCOPYRROLATE PF 0.2 MG/ML IJ SOSY
PREFILLED_SYRINGE | INTRAMUSCULAR | Status: AC
  Filled 2020-09-19: qty 2

## 2020-09-19 MED ORDER — SODIUM CHLORIDE 0.9 % IV SOLN
INTRAVENOUS | Status: DC | PRN
  Administered 2020-09-19 (×4): 80 ug via INTRAVENOUS

## 2020-09-19 SURGICAL SUPPLY — 68 items
APPLICATOR ARISTA FLEXITIP XL (MISCELLANEOUS) IMPLANT
APPLICATOR SURGIFLO ENDO (HEMOSTASIS) ×3 IMPLANT
BAG COUNTER SPONGE SURGICOUNT (BAG) ×2 IMPLANT
BAG SURGICOUNT SPONGE COUNTING (BAG) ×1
CHLORAPREP W/TINT 26 (MISCELLANEOUS) ×3 IMPLANT
CLIP VESOLOCK LG 6/CT PURPLE (CLIP) ×3 IMPLANT
CLIP VESOLOCK MED LG 6/CT (CLIP) ×6 IMPLANT
CLIP VESOLOCK XL 6/CT (CLIP) ×3 IMPLANT
COVER SURGICAL LIGHT HANDLE (MISCELLANEOUS) ×3 IMPLANT
COVER TIP SHEARS 8 DVNC (MISCELLANEOUS) ×1 IMPLANT
COVER TIP SHEARS 8MM DA VINCI (MISCELLANEOUS) ×3
CUTTER ECHEON FLEX ENDO 45 340 (ENDOMECHANICALS) IMPLANT
DECANTER SPIKE VIAL GLASS SM (MISCELLANEOUS) ×3 IMPLANT
DERMABOND ADVANCED (GAUZE/BANDAGES/DRESSINGS) ×2
DERMABOND ADVANCED .7 DNX12 (GAUZE/BANDAGES/DRESSINGS) ×1 IMPLANT
DRAIN CHANNEL 15F RND FF 3/16 (WOUND CARE) ×3 IMPLANT
DRAPE ARM DVNC X/XI (DISPOSABLE) ×4 IMPLANT
DRAPE COLUMN DVNC XI (DISPOSABLE) ×1 IMPLANT
DRAPE DA VINCI XI ARM (DISPOSABLE) ×12
DRAPE DA VINCI XI COLUMN (DISPOSABLE) ×2
DRAPE INCISE IOBAN 66X45 STRL (DRAPES) ×3 IMPLANT
DRAPE SHEET LG 3/4 BI-LAMINATE (DRAPES) ×3 IMPLANT
ELECT PENCIL ROCKER SW 15FT (MISCELLANEOUS) ×3 IMPLANT
ELECT REM PT RETURN 15FT ADLT (MISCELLANEOUS) ×3 IMPLANT
EVACUATOR SILICONE 100CC (DRAIN) ×3 IMPLANT
GAUZE 4X4 16PLY ~~LOC~~+RFID DBL (SPONGE) ×3 IMPLANT
GLOVE SURG ENC MOIS LTX SZ6.5 (GLOVE) ×3 IMPLANT
GLOVE SURG ENC TEXT LTX SZ7.5 (GLOVE) ×6 IMPLANT
GOWN STRL REUS W/TWL LRG LVL3 (GOWN DISPOSABLE) ×3 IMPLANT
GOWN STRL REUS W/TWL XL LVL3 (GOWN DISPOSABLE) ×6 IMPLANT
HEMOSTAT ARISTA ABSORB 3G PWDR (HEMOSTASIS) IMPLANT
HOLDER FOLEY CATH W/STRAP (MISCELLANEOUS) IMPLANT
IRRIG SUCT STRYKERFLOW 2 WTIP (MISCELLANEOUS) ×3
IRRIGATION SUCT STRKRFLW 2 WTP (MISCELLANEOUS) ×1 IMPLANT
KIT BASIN OR (CUSTOM PROCEDURE TRAY) ×3 IMPLANT
KIT TURNOVER KIT A (KITS) ×3 IMPLANT
LOOP VESSEL MAXI BLUE (MISCELLANEOUS) ×3 IMPLANT
MARKER SKIN DUAL TIP RULER LAB (MISCELLANEOUS) ×3 IMPLANT
NEEDLE INSUFFLATION 14GA 120MM (NEEDLE) IMPLANT
NS IRRIG 1000ML POUR BTL (IV SOLUTION) ×3 IMPLANT
PAD POSITIONING PINK XL (MISCELLANEOUS) ×3 IMPLANT
POUCH SPECIMEN RETRIEVAL 10MM (ENDOMECHANICALS) ×3 IMPLANT
PROTECTOR NERVE ULNAR (MISCELLANEOUS) ×6 IMPLANT
SEAL CANN UNIV 5-8 DVNC XI (MISCELLANEOUS) ×4 IMPLANT
SEAL XI 5MM-8MM UNIVERSAL (MISCELLANEOUS) ×12
SET TUBE SMOKE EVAC HIGH FLOW (TUBING) ×3 IMPLANT
SOLUTION ELECTROLUBE (MISCELLANEOUS) ×3 IMPLANT
SPONGE T-LAP 18X18 ~~LOC~~+RFID (SPONGE) ×3 IMPLANT
STAPLE RELOAD 45 WHT (STAPLE) IMPLANT
STAPLE RELOAD 45MM WHITE (STAPLE)
SURGIFLO W/THROMBIN 8M KIT (HEMOSTASIS) ×3 IMPLANT
SUT ETHILON 3 0 PS 1 (SUTURE) ×3 IMPLANT
SUT MNCRL AB 4-0 PS2 18 (SUTURE) ×6 IMPLANT
SUT V-LOC BARB 180 2/0GR6 GS22 (SUTURE) ×3
SUT VIC AB 0 CT1 27 (SUTURE) ×3
SUT VIC AB 0 CT1 27XBRD ANTBC (SUTURE) ×1 IMPLANT
SUT VICRYL 0 UR6 27IN ABS (SUTURE) ×6 IMPLANT
SUT VLOC BARB 180 ABS3/0GR12 (SUTURE) ×3
SUTURE V-LC BRB 180 2/0GR6GS22 (SUTURE) ×1 IMPLANT
SUTURE VLOC BRB 180 ABS3/0GR12 (SUTURE) ×1 IMPLANT
TOWEL OR 17X26 10 PK STRL BLUE (TOWEL DISPOSABLE) ×3 IMPLANT
TOWEL OR NON WOVEN STRL DISP B (DISPOSABLE) ×3 IMPLANT
TRAY FOLEY MTR SLVR 16FR STAT (SET/KITS/TRAYS/PACK) ×3 IMPLANT
TRAY LAPAROSCOPIC (CUSTOM PROCEDURE TRAY) ×3 IMPLANT
TROCAR BLADELESS OPT 5 100 (ENDOMECHANICALS) ×3 IMPLANT
TROCAR ENDOPATH XCEL 12X100 BL (ENDOMECHANICALS) IMPLANT
TROCAR XCEL 12X100 BLDLESS (ENDOMECHANICALS) ×3 IMPLANT
WATER STERILE IRR 1000ML POUR (IV SOLUTION) ×3 IMPLANT

## 2020-09-19 NOTE — Anesthesia Postprocedure Evaluation (Signed)
Anesthesia Post Note  Patient: Adam Noble  Procedure(s) Performed: XI ROBOTIC ASSITED RIGHT PARTIAL NEPHRECTOMY (Right)     Patient location during evaluation: PACU Anesthesia Type: General Level of consciousness: awake and alert Pain management: pain level controlled Vital Signs Assessment: post-procedure vital signs reviewed and stable Respiratory status: spontaneous breathing, nonlabored ventilation, respiratory function stable and patient connected to nasal cannula oxygen Cardiovascular status: blood pressure returned to baseline and stable Postop Assessment: no apparent nausea or vomiting Anesthetic complications: no   No notable events documented.  Last Vitals:  Vitals:   09/19/20 1230 09/19/20 1245  BP: (!) 157/86 (!) 154/90  Pulse: 77 82  Resp: 12 14  Temp:    SpO2: 98% 98%    Last Pain:  Vitals:   09/19/20 1245  TempSrc:   PainSc: 0-No pain                 Mazzie Brodrick S

## 2020-09-19 NOTE — Interval H&P Note (Signed)
History and Physical Interval Note:  09/19/2020 7:08 AM  Adam Noble  has presented today for surgery, with the diagnosis of RIGHT RENAL MASS.  The various methods of treatment have been discussed with the patient and family. After consideration of risks, benefits and other options for treatment, the patient has consented to  Procedure(s): XI ROBOTIC ASSITED RIGHT PARTIAL NEPHRECTOMY (Right) as a surgical intervention.  The patient's history has been reviewed, patient examined, no change in status, stable for surgery.  I have reviewed the patient's chart and labs.  Questions were answered to the patient's satisfaction.     [name]

## 2020-09-19 NOTE — Transfer of Care (Signed)
Immediate Anesthesia Transfer of Care Note  Patient: Adam Noble  Procedure(s) Performed: XI ROBOTIC ASSITED RIGHT PARTIAL NEPHRECTOMY (Right)  Patient Location: PACU  Anesthesia Type:General  Level of Consciousness: drowsy and patient cooperative  Airway & Oxygen Therapy: Patient Spontanous Breathing and Patient connected to face mask oxygen  Post-op Assessment: Report given to RN and Post -op Vital signs reviewed and stable  Post vital signs: Reviewed and stable  Last Vitals:  Vitals Value Taken Time  BP 173/101 09/19/20 1153  Temp    Pulse 83 09/19/20 1157  Resp 15 09/19/20 1157  SpO2 100 % 09/19/20 1157  Vitals shown include unvalidated device data.  Last Pain:  Vitals:   09/19/20 0604  TempSrc: Oral  PainSc:          Complications: No notable events documented.

## 2020-09-19 NOTE — Op Note (Signed)
Preoperative diagnosis:  right renal mass   Postoperative diagnosis:  same   Procedure: Robotic assisted laparoscopic right partial nephrectomy  Surgeon: [name], MD 1st assistant: Debbrah Alar, PA-C Resident Assistant: Darl Pikes  An assistant was required for this surgical procedure.  The duties of the assistant included but were not limited to suctioning, passing suture, camera manipulation, retraction. This procedure would not be able to be performed without an Environmental consultant.   Anesthesia: General  Complications: None  Intraoperative findings:  #1. Posterior mid-kidney mass.   #2. Warm ischemia time <10 min  EBL: 50cc  Specimens: right renal mass   Indication:  Adam Noble is a 65 y.o. patient with right renal mass.  After reviewing the management options for treatment, he elected to proceed with the above surgical procedure(s). We have discussed the potential benefits and risks of the procedure, side effects of the proposed treatment, the likelihood of the patient achieving the goals of the procedure, and any potential problems that might occur during the procedure or recuperation. Informed consent has been obtained.   Description of procedure:  The patient was taken to the operating room and a general anesthetic was administered. The patient was given preoperative antibiotics, placed in the right modified flank position with care to pad all potential pressure points, and prepped and draped in the usual sterile fashion. Next a preoperative timeout was performed.  A site was selected on the right side of the umbilicus for placement of the camera port. This was placed using a standard modified Hassan technique with entry into the peritoneum with a 10 mm 0 deg laparoscope with a visual obturator. We entered the peritoneum without incident and established pneumoperitoneum.  The camera was then used to inspect the abdomen and there was no evidence of any  intra-abdominal injuries or other abnormalities. The remaining abdominal ports were then placed. 8 mm robotic ports were placed in the right upper quadrant, right lower quadrant, and far right lateral abdominal wall. A 12 mm port was placed in the upper midline for laparoscopic assistance. Lysis of adhesions were performed at the midline to allow placement of the 12 mm assistant port. All ports were placed under direct vision without difficulty. The surgical cart was then docked.   Utilizing the cautery scissors, the white line of Toldt was incised allowing the colon to be mobilized medially and the plane between the mesocolon and the anterior layer of Gerota's fascia to be developed and the kidney to be exposed.  The ureter and gonadal vein were identified inferiorly and the ureter was lifted anteriorly off the psoas muscle.  Dissection proceeded superiorly along the gonadal vein until the renal vein was identified.  The renal hilum was then carefully isolated with a combination of blunt and sharp dissection allowing the renal arterial and venous structures to be separated and isolated in preparation for renal hilar vessel clamping.  Attention turned to the kidney and the perinephric fat surrounding the renal mass was removed and the kidney was mobilized sufficiently for exposure and resection of the renal mass. The margins were then demarcated using intraoperative ultrasound.  Once the renal mass was properly isolated, preparations were made for resection of the tumor.    The renal artery was then clamped with a bulldog clamp.  The tumor was then excised with cold scissor dissection along with an adequate visible gross margin of normal renal parenchyma. The tumor appeared to be excised without any gross violation of the tumor. The renal  collecting system was not entered during removal of the tumor.  A running 3-0 V-lock suture was then brought through the capsule of the kidney and run along the base of the  renal defect to provide hemostasis and close any entry into the renal collecting system if present. Weck clips were used to secure this suture outside the renal capsule at the proximal and distal ends. The bulldog clamps were then removed from the renal hilar vessel. A running 2-0 V lock suture was then used to close the capsule of the kidney using a sliding clip technique which resulted in excellent hemostasis. An additional hemostatic agent (Surgiflo) was then placed into the renal defect. Surgicel was then placed over the defect.    Total warm renal ischemia time was  10 minutes. The renal tumor resection site was examined. Hemostasis appeared adequate.   The kidney was placed back into its normal anatomic position and covered with perinephric fat as needed.  The surgical robotic cart was undocked.  The renal tumor specimen was removed intact within an endopouch retrieval bag via the camera port sites.  The camera port site and the other 12 mm port site were then closed at the fascial level with 0-vicryl suture.  All other laparoscopic/robotic ports were removed under direct vision and the pneumoperitoneum let down with inspection of the operative field performed and hemostasis again confirmed. All incision sites were then injected with local anesthetic and reapproximated at the skin level with 4-0 monocryl subcuticular closures. Dermabond was applied to the skin.  The patient tolerated the procedure well and without complications.  The patient was able to be extubated and transferred to the recovery unit in satisfactory condition.  [name], M.D.

## 2020-09-19 NOTE — Anesthesia Procedure Notes (Signed)
Procedure Name: Intubation Date/Time: 09/19/2020 7:35 AM Performed by: Jari Pigg, CRNA Pre-anesthesia Checklist: Patient identified, Emergency Drugs available, Suction available and Patient being monitored Patient Re-evaluated:Patient Re-evaluated prior to induction Oxygen Delivery Method: Circle system utilized Preoxygenation: Pre-oxygenation with 100% oxygen Induction Type: IV induction Ventilation: Mask ventilation without difficulty Laryngoscope Size: Mac and 4 Grade View: Grade II Tube type: Oral Tube size: 7.5 mm Number of attempts: 1 Airway Equipment and Method: Stylet and Oral airway Placement Confirmation: ETT inserted through vocal cords under direct vision, positive ETCO2 and breath sounds checked- equal and bilateral Secured at: 23 cm Tube secured with: Tape Dental Injury: Teeth and Oropharynx as per pre-operative assessment

## 2020-09-19 NOTE — H&P (Signed)
Renal Mass  HPI: Adam Noble is a 65 year-old male established patient who is here further eval and management of a renal mass.  The mass is on the right side.   The lesion(s) was first noted on approximately 05/21/2020. The mass was seen on MRI Scan.   He has had no symptoms. He has not seen blood in his urine. He does have a good appetite. He is not having pain in new locations. He has not recently had unwanted weight loss.   He has not had previous abdominal surgery. The patient can walk a flight of steps.   The patient denies history of diabetes, heart attack or stroke. There is not a a family history of kidney cancer. There is a family history of brain tumors (AMLs), seizures or brain aneurysm's.   Father died of brain cancer.   Small 1 cm enhancing lesion on right posterior lower pole.     ALLERGIES: No Known Drug Allergies    MEDICATIONS: Finasteride 5 mg tablet TAKE 1 TABLET DAILY  Aspirin 81 MG TABS Oral  Atorvastatin Calcium 10 mg tablet Oral  HydroCHLOROthiazide 12.5 MG Oral Capsule Oral     GU PSH: None     PSH Notes: Primary Repair Of Ruptured Achilles Tendon, Foot Repair, Dermatological Surgery, Foot Surgery, Tonsillectomy With Adenoidectomy  Left Forearm Melanoma removal   NON-GU PSH: Remove Tonsils And Adenoids - 2010 Repair Achilles Tendon - 2014     GU PMH: Benign Neo Kidney, Unspec - 07/11/2020 Renal cyst, bilateral renal cysts, left larger than right. I doubt this is causing his pain. - 06/03/2020 Right renal neoplasm, new found probable solid right renal mass, 12 mm in size. Contralateral to the pain he was having which is most likely a GI nature - 06/03/2020 BPH w/LUTS, ON finasteride--voiding well - 01/31/2020, Only reporting moderate symptomatology which he tolerates well on finasteride. He is not currently interested in surgical intervention. , - 01/04/2019 Elevated PSA, PSA/DRE stable - 01/31/2020, (Stable), PSA only slightly elevated since last  measurement. He is tolerating finasteride well. , - 01/04/2019 (Stable), Stable exam, stable PSA. He does have moderate voiding symptoms despite being on finasteride, - 2019 (Stable), Prostate exam is still benign. PSA was recently drawn, apparently under 4. He must remember that he is on finasteride., - 2018, - 2017, Elevated prostate specific antigen (PSA), - 2016 Weak Urinary Stream, This is only an issue intermittently and he apparently tolerates it well. - 01/04/2019 BPH w/o LUTS, Benign prostatic hyperplasia with elevated prostate specific antigen (PSA) - 2016      PMH Notes:  2008-10-23 13:46:15 - Note: Basal Cell Carcinoma Of The Skin   NON-GU PMH: Encounter for general adult medical examination without abnormal findings, Encounter for preventive health examination - 2016 Decreased libido, Decreased libido - 2015 Personal history of other diseases of the circulatory system, History of hypertension - 2014 Personal history of other endocrine, nutritional and metabolic disease, History of hypercholesterolemia - 2014 Skin Cancer, History    FAMILY HISTORY: Brain tumor - Father Death In The Family Father - Runs In Family Family Health Status Number - Runs In Family Heart Attack - Brother Hypertension - Mother Pure Hypercholesterolemia - Mother   SOCIAL HISTORY: Marital Status: Married Preferred Language: English; Ethnicity: Not Hispanic Or Latino; Race: White Current Smoking Status: Patient has never smoked.  Does not use smokeless tobacco. Does drink.  Does not use drugs. Drinks 1 caffeinated drink per day. Has not had a blood transfusion. Patient's occupation  Heritage manager.    REVIEW OF SYSTEMS:    GU Review Male:   Patient denies frequent urination, hard to postpone urination, burning/ pain with urination, get up at night to urinate, leakage of urine, stream starts and stops, trouble starting your stream, have to strain to urinate , erection problems, and penile pain.   Gastrointestinal (Upper):   Patient denies nausea, vomiting, and indigestion/ heartburn.  Gastrointestinal (Lower):   Patient denies diarrhea and constipation.  Constitutional:   Patient denies fever, night sweats, weight loss, and fatigue.  Skin:   Patient denies skin rash/ lesion and itching.  Eyes:   Patient denies double vision and blurred vision.  Ears/ Nose/ Throat:   Patient denies sore throat and sinus problems.  Hematologic/Lymphatic:   Patient denies swollen glands and easy bruising.  Cardiovascular:   Patient denies leg swelling and chest pains.  Respiratory:   Patient denies cough and shortness of breath.  Endocrine:   Patient denies excessive thirst.  Musculoskeletal:   Patient denies back pain and joint pain.  Neurological:   Patient denies headaches and dizziness.  Psychologic:   Patient denies depression and anxiety.   VITAL SIGNS:      07/30/2020 03:37 PM  Weight 172 lb / 78.02 kg  BP 160/79 mmHg  Pulse 69 /min  Temperature 97.5 F / 36.3 C   MULTI-SYSTEM PHYSICAL EXAMINATION:    Constitutional: Well-nourished. No physical deformities. Normally developed. Good grooming.  Respiratory: Normal breath sounds. No labored breathing, no use of accessory muscles.   Cardiovascular: Regular rate and rhythm. No murmur, no gallop. Normal temperature, normal extremity pulses, no swelling, no varicosities.      Complexity of Data:  Source Of History:  Patient  Records Review:   Previous Doctor Records, Previous Patient Records  Urine Test Review:   Urinalysis  X-Ray Review: C.T. Abdomen/Pelvis: Reviewed Films. Discussed With Patient.  MRI Abdomen: Reviewed Films. Discussed With Patient.     11/07/19 10/17/18 10/07/17 10/01/16 10/02/15 11/01/13 10/21/12 01/25/12  PSA  Total PSA 3.686 ng/dl 3.524 ng/dl 3.145 ng/dl 3.259 ng/dl 2.616 ng/dl 2.85  4.77  3.09   Free PSA       0.69    % Free PSA       14      10/31/13 10/21/12  Hormones  Testosterone, Total 578  758      PROCEDURES: None   ASSESSMENT:      ICD-10 Details  1 GU:   Benign Neo Kidney, Unspec - D30.00    PLAN:           Document Letter(s):  Created for Patient: Clinical Summary    The patient has been given the natural history of renal cancer, treatment options, and I recommended surgical extirpation for this patient. I went over the robotic-assisted laparoscopic partial nephrectomy approach. I described for the patient the procedure in detail including port placement. I detailed the postoperative course including the fact that the patient would have both a drain and a Foley catheter following the surgery. I told the patient that most often patients are discharged on postoperative day one or 2. I then detailed the expected recovery time, I told the patient that he would not be able to lift anything greater than 20 pounds for 4 weeks. I also went over the risks and benefits of this operation in great detail. We discussed the risk of injury to surrounding structures, major blood vessels and nerves, bleeding, infection, loss of kidney, and the risk of  recurrent cancer.         Notes:   The patient has a very small all right posterior lateral mid pole renal mass concerning for a very small renal cell carcinoma. Previously we discussed management strategies and I recommended active surveillance. However, after discussing this with his wife the patient was not willing to follow this and wanted it removed.   The renal anatomy is such that he has a simple renal hilum.   He has already been scheduled for the surgery.

## 2020-09-19 NOTE — Anesthesia Preprocedure Evaluation (Signed)
Anesthesia Evaluation  Patient identified by MRN, date of birth, ID band Patient awake    Reviewed: Allergy & Precautions, NPO status , Patient's Chart, lab work & pertinent test results  Airway Mallampati: II  TM Distance: >3 FB Neck ROM: Full    Dental no notable dental hx.    Pulmonary neg pulmonary ROS,    Pulmonary exam normal breath sounds clear to auscultation       Cardiovascular hypertension, Pt. on medications Normal cardiovascular exam Rhythm:Regular Rate:Normal     Neuro/Psych negative neurological ROS  negative psych ROS   GI/Hepatic negative GI ROS, Neg liver ROS,   Endo/Other  negative endocrine ROS  Renal/GU negative Renal ROS  negative genitourinary   Musculoskeletal negative musculoskeletal ROS (+)   Abdominal   Peds negative pediatric ROS (+)  Hematology negative hematology ROS (+)   Anesthesia Other Findings   Reproductive/Obstetrics negative OB ROS                             Anesthesia Physical Anesthesia Plan  ASA: 2  Anesthesia Plan: General   Post-op Pain Management:    Induction: Intravenous  PONV Risk Score and Plan: 2 and Ondansetron, Dexamethasone and Treatment may vary due to age or medical condition  Airway Management Planned: Oral ETT  Additional Equipment:   Intra-op Plan:   Post-operative Plan: Extubation in OR  Informed Consent: I have reviewed the patients History and Physical, chart, labs and discussed the procedure including the risks, benefits and alternatives for the proposed anesthesia with the patient or authorized representative who has indicated his/her understanding and acceptance.     Dental advisory given  Plan Discussed with: CRNA and Surgeon  Anesthesia Plan Comments:         Anesthesia Quick Evaluation

## 2020-09-19 NOTE — Discharge Instructions (Signed)

## 2020-09-20 ENCOUNTER — Encounter (HOSPITAL_COMMUNITY): Payer: Self-pay | Admitting: Urology

## 2020-09-20 DIAGNOSIS — C641 Malignant neoplasm of right kidney, except renal pelvis: Secondary | ICD-10-CM | POA: Diagnosis not present

## 2020-09-20 DIAGNOSIS — N2889 Other specified disorders of kidney and ureter: Secondary | ICD-10-CM | POA: Diagnosis not present

## 2020-09-20 DIAGNOSIS — Z7982 Long term (current) use of aspirin: Secondary | ICD-10-CM | POA: Diagnosis not present

## 2020-09-20 DIAGNOSIS — I10 Essential (primary) hypertension: Secondary | ICD-10-CM | POA: Diagnosis not present

## 2020-09-20 DIAGNOSIS — Z85828 Personal history of other malignant neoplasm of skin: Secondary | ICD-10-CM | POA: Diagnosis not present

## 2020-09-20 LAB — BASIC METABOLIC PANEL
Anion gap: 4 — ABNORMAL LOW (ref 5–15)
BUN: 14 mg/dL (ref 8–23)
CO2: 28 mmol/L (ref 22–32)
Calcium: 8.4 mg/dL — ABNORMAL LOW (ref 8.9–10.3)
Chloride: 103 mmol/L (ref 98–111)
Creatinine, Ser: 1.34 mg/dL — ABNORMAL HIGH (ref 0.61–1.24)
GFR, Estimated: 59 mL/min — ABNORMAL LOW (ref >60)
Glucose, Bld: 131 mg/dL — ABNORMAL HIGH (ref 70–99)
Potassium: 4.1 mmol/L (ref 3.5–5.1)
Sodium: 135 mmol/L (ref 135–145)

## 2020-09-20 LAB — URINE CULTURE: Culture: NO GROWTH

## 2020-09-20 LAB — CBC
HCT: 41.9 % (ref 39.0–52.0)
Hemoglobin: 14.1 g/dL (ref 13.0–17.0)
MCH: 29.1 pg (ref 26.0–34.0)
MCHC: 33.7 g/dL (ref 30.0–36.0)
MCV: 86.6 fL (ref 80.0–100.0)
Platelets: 220 10*3/uL (ref 150–400)
RBC: 4.84 MIL/uL (ref 4.22–5.81)
RDW: 12.7 % (ref 11.5–15.5)
WBC: 12.7 10*3/uL — ABNORMAL HIGH (ref 4.0–10.5)
nRBC: 0 % (ref 0.0–0.2)

## 2020-09-20 LAB — SURGICAL PATHOLOGY

## 2020-09-20 MED ORDER — SIMETHICONE 80 MG PO CHEW: 80.0000 mg | CHEWABLE_TABLET | Freq: Four times a day (QID) | ORAL | Status: DC | PRN

## 2020-09-20 MED ORDER — MORPHINE SULFATE (PF) 2 MG/ML IV SOLN
2.0000 mg | Freq: Once | INTRAVENOUS | Status: AC
  Administered 2020-09-20: 2 mg via INTRAVENOUS
  Filled 2020-09-20: qty 1

## 2020-09-20 NOTE — Progress Notes (Signed)
Urology Progress Note   1 Day Post-Op from robotic right partial nephrectomy with Dr. Louis Meckel on 09/19/2020.   Subjective: NAEON.  Given one-time dose of morphine IV 2 mg for pain.  Using tramadol as needed for pain.  Making adequate urine output.  Vital signs are stable.  Creatinine 1.3 this morning.  Hemoglobin 14.  Objective: Vital signs in last 24 hours: Temp:  [97.8 F (36.6 C)-99.8 F (37.7 C)] 98.2 F (36.8 C) (07/01 2820) Pulse Rate:  [77-109] 83 (07/01 0614) Resp:  [12-20] 18 (07/01 0614) BP: (115-173)/(66-101) 133/81 (07/01 0614) SpO2:  [98 %-100 %] 100 % (07/01 6015)  Intake/Output from previous day: 06/30 0701 - 07/01 0700 In: 2937.2 [I.V.:2837.2; IV Piggyback:100] Out: 2050 [Urine:2000; Blood:50] Intake/Output this shift: Total I/O In: 1077.1 [I.V.:1027.1; IV Piggyback:50] Out: 6153 [Urine:1850]  Physical Exam:  General: Alert and oriented CV: Regular rate Lungs: No increased work of breathing Abdomen:  Soft, appropriately tender. Incisions c/d/i.  GU: Foley in place draining clear yellow urine  Ext: NT, No erythema  Lab Results: Recent Labs    09/19/20 0555 09/19/20 1213 09/20/20 0427  HGB 16.6 16.1 14.1  HCT 49.8 48.3 41.9   Recent Labs    09/19/20 0555 09/20/20 0427  NA 137 135  K 3.6 4.1  CL 102 103  CO2 28 28  GLUCOSE 93 131*  BUN 17 14  CREATININE 1.23 1.34*  CALCIUM 9.4 8.4*    Studies/Results: No results found.  Assessment/Plan:  65 y.o. male s/p robotic right partial nephrectomy.  Overall doing well post-op.   -[place] -Advance diet as tolerated -DC Foley trial void -Ambulate -Potentially home later today  Dispo: floor, possibly home today   LOS: 0 days

## 2020-09-20 NOTE — Plan of Care (Signed)
  Problem: Activity: Goal: Risk for activity intolerance will decrease Outcome: Progressing   Problem: Bowel/Gastric: Goal: Gastrointestinal status for postoperative course will improve Outcome: Progressing   Problem: Health Behavior/Discharge Planning: Goal: Ability to manage health-related needs will improve Outcome: Adequate for Discharge   Problem: Clinical Measurements: Goal: Will remain free from infection Outcome: Adequate for Discharge   Problem: Safety: Goal: Ability to remain free from injury will improve Outcome: Adequate for Discharge   Problem: Safety: Goal: Ability to remain free from injury will improve Outcome: Adequate for Discharge

## 2020-09-20 NOTE — Progress Notes (Signed)
Pt voided post catheter removal. Urine clear straw yellow. No pain with urination.

## 2020-09-20 NOTE — Plan of Care (Signed)
  Problem: Education: Goal: Knowledge of General Education information will improve Description: Including pain rating scale, medication(s)/side effects and non-pharmacologic comfort measures Outcome: Adequate for Discharge   Problem: Health Behavior/Discharge Planning: Goal: Ability to manage health-related needs will improve 09/20/2020 1218 by Lennie Hummer, RN Outcome: Adequate for Discharge 09/20/2020 1146 by Lennie Hummer, RN Outcome: Adequate for Discharge   Problem: Clinical Measurements: Goal: Ability to maintain clinical measurements within normal limits will improve Outcome: Adequate for Discharge Goal: Will remain free from infection 09/20/2020 1218 by Lennie Hummer, RN Outcome: Adequate for Discharge 09/20/2020 1146 by Lennie Hummer, RN Outcome: Adequate for Discharge Goal: Diagnostic test results will improve Outcome: Adequate for Discharge Goal: Cardiovascular complication will be avoided Outcome: Adequate for Discharge   Problem: Activity: Goal: Risk for activity intolerance will decrease 09/20/2020 1218 by Lennie Hummer, RN Outcome: Adequate for Discharge 09/20/2020 1146 by Lennie Hummer, RN Outcome: Progressing   Problem: Nutrition: Goal: Adequate nutrition will be maintained Outcome: Adequate for Discharge   Problem: Coping: Goal: Level of anxiety will decrease Outcome: Adequate for Discharge   Problem: Elimination: Goal: Will not experience complications related to bowel motility Outcome: Adequate for Discharge Goal: Will not experience complications related to urinary retention Outcome: Adequate for Discharge   Problem: Pain Managment: Goal: General experience of comfort will improve Outcome: Adequate for Discharge   Problem: Safety: Goal: Ability to remain free from injury will improve 09/20/2020 1218 by Lennie Hummer, RN Outcome: Adequate for Discharge 09/20/2020 1146 by Lennie Hummer, RN Outcome: Adequate for Discharge   Problem: Skin  Integrity: Goal: Risk for impaired skin integrity will decrease Outcome: Adequate for Discharge   Problem: Education: Goal: Knowledge of the prescribed therapeutic regimen will improve Outcome: Adequate for Discharge   Problem: Bowel/Gastric: Goal: Gastrointestinal status for postoperative course will improve 09/20/2020 1218 by Lennie Hummer, RN Outcome: Adequate for Discharge 09/20/2020 1146 by Lennie Hummer, RN Outcome: Progressing   Problem: Clinical Measurements: Goal: Postoperative complications will be avoided or minimized Outcome: Adequate for Discharge   Problem: Respiratory: Goal: Ability to achieve and maintain a regular respiratory rate will improve Outcome: Adequate for Discharge   Problem: Skin Integrity: Goal: Demonstration of wound healing without infection will improve Outcome: Adequate for Discharge   Problem: Urinary Elimination: Goal: Ability to avoid or minimize complications of infection will improve Outcome: Adequate for Discharge Goal: Ability to achieve and maintain urine output will improve Outcome: Adequate for Discharge

## 2020-09-20 NOTE — Discharge Summary (Signed)
[facility] Discharge Summary  Admit date: 09/19/2020  Discharge date and time: 09/20/20   Discharge to: Home  Discharge Service: Urology  Discharge Attending Physician:  Burman Nieves, MD  Discharge  Diagnoses: Right Renal mass  Secondary Diagnosis: Active Problems:   Renal mass   OR Procedures: Procedure(s): XI ROBOTIC ASSITED RIGHT PARTIAL NEPHRECTOMY 09/19/2020   Ancillary Procedures: None   Discharge Day Services: The patient was seen and examined by the Urology team both in the morning and immediately prior to discharge.  Vital signs and laboratory values were stable and within normal limits.  The physical exam was benign and unchanged and all surgical wounds were examined.  Discharge instructions were explained and all questions answered.  Subjective  No acute events overnight. Pain Controlled. No fever or chills.  Objective Patient Vitals for the past 8 hrs:  BP Temp Temp src Pulse Resp SpO2  09/20/20 0614 133/81 98.2 F (36.8 C) Oral 83 18 100 %  09/20/20 0138 115/66 99.3 F (37.4 C) Oral 94 15 98 %   No intake/output data recorded.  General Appearance:        No acute distress Lungs:                       Normal work of breathing on room air Heart:                                Regular rate and rhythm Abdomen:                         Soft, non-tender, non-distended Incisions C/D/I Extremities:                      Warm and well perfused   Hospital Course:  The patient underwent Right robotic partial nephrectomy on 09/19/2020.  The patient tolerated the procedure well, was extubated in the OR, and afterwards was taken to the PACU for routine post-surgical care. When stable the patient was transferred to the floor.   The patient did well postoperatively.  The patient's diet was slowly advanced and at the time of discharge was tolerating a regular diet.  The patient was discharged home 1 Day Post-Op, at which point was tolerating a regular solid diet, was able  to void spontaneously, have adequate pain control with P.O. pain medication, and could ambulate without difficulty. The patient will follow up with [place] for post op check.   Condition at Discharge: Improved  Discharge Medications:  Allergies as of 09/20/2020   No Known Allergies      Medication List     STOP taking these medications    aspirin EC 81 MG tablet       TAKE these medications    atorvastatin 20 MG tablet Commonly known as: LIPITOR Take 20 mg by mouth daily.   azelastine 0.1 % nasal spray Commonly known as: ASTELIN Place 1 spray into both nostrils 2 (two) times daily. Use in each nostril as directed   docusate sodium 100 MG capsule Commonly known as: COLACE Take 1 capsule (100 mg total) by mouth 2 (two) times daily.   finasteride 5 MG tablet Commonly known as: PROSCAR Take 5 mg by mouth daily.   hydrochlorothiazide 25 MG tablet Commonly known as: HYDRODIURIL Take 25 mg by mouth daily.   oxymetazoline 0.05 % nasal spray Commonly known as: Melissa Montane  Place 1 spray into both nostrils at bedtime as needed for congestion.   traMADol 50 MG tablet Commonly known as: Ultram Take 1-2 tablets (50-100 mg total) by mouth every 6 (six) hours as needed for moderate pain or severe pain.       ASK your doctor about these medications    fluticasone 50 MCG/ACT nasal spray Commonly known as: FLONASE USE 1 TO 2 SPRAYS IN EACH NOSTRIL ONCE DAILY AS DIRECTED

## 2020-09-25 DIAGNOSIS — D49511 Neoplasm of unspecified behavior of right kidney: Secondary | ICD-10-CM | POA: Diagnosis not present

## 2020-10-01 DIAGNOSIS — J3081 Allergic rhinitis due to animal (cat) (dog) hair and dander: Secondary | ICD-10-CM | POA: Diagnosis not present

## 2020-10-01 DIAGNOSIS — J301 Allergic rhinitis due to pollen: Secondary | ICD-10-CM | POA: Diagnosis not present

## 2020-10-01 DIAGNOSIS — J3089 Other allergic rhinitis: Secondary | ICD-10-CM | POA: Diagnosis not present

## 2020-10-04 DIAGNOSIS — D49511 Neoplasm of unspecified behavior of right kidney: Secondary | ICD-10-CM | POA: Diagnosis not present

## 2020-10-10 DIAGNOSIS — J301 Allergic rhinitis due to pollen: Secondary | ICD-10-CM | POA: Diagnosis not present

## 2020-10-10 DIAGNOSIS — J3089 Other allergic rhinitis: Secondary | ICD-10-CM | POA: Diagnosis not present

## 2020-10-10 DIAGNOSIS — J3081 Allergic rhinitis due to animal (cat) (dog) hair and dander: Secondary | ICD-10-CM | POA: Diagnosis not present

## 2020-10-15 DIAGNOSIS — L570 Actinic keratosis: Secondary | ICD-10-CM | POA: Diagnosis not present

## 2020-10-15 DIAGNOSIS — L111 Transient acantholytic dermatosis [Grover]: Secondary | ICD-10-CM | POA: Diagnosis not present

## 2020-10-15 DIAGNOSIS — L820 Inflamed seborrheic keratosis: Secondary | ICD-10-CM | POA: Diagnosis not present

## 2020-10-15 DIAGNOSIS — Z85820 Personal history of malignant melanoma of skin: Secondary | ICD-10-CM | POA: Diagnosis not present

## 2020-10-15 DIAGNOSIS — D225 Melanocytic nevi of trunk: Secondary | ICD-10-CM | POA: Diagnosis not present

## 2020-10-29 DIAGNOSIS — J3081 Allergic rhinitis due to animal (cat) (dog) hair and dander: Secondary | ICD-10-CM | POA: Diagnosis not present

## 2020-10-29 DIAGNOSIS — J301 Allergic rhinitis due to pollen: Secondary | ICD-10-CM | POA: Diagnosis not present

## 2020-10-29 DIAGNOSIS — J3089 Other allergic rhinitis: Secondary | ICD-10-CM | POA: Diagnosis not present

## 2020-11-26 DIAGNOSIS — J3081 Allergic rhinitis due to animal (cat) (dog) hair and dander: Secondary | ICD-10-CM | POA: Diagnosis not present

## 2020-11-26 DIAGNOSIS — J3089 Other allergic rhinitis: Secondary | ICD-10-CM | POA: Diagnosis not present

## 2020-11-26 DIAGNOSIS — J301 Allergic rhinitis due to pollen: Secondary | ICD-10-CM | POA: Diagnosis not present

## 2020-12-17 DIAGNOSIS — N401 Enlarged prostate with lower urinary tract symptoms: Secondary | ICD-10-CM | POA: Diagnosis not present

## 2020-12-17 DIAGNOSIS — I10 Essential (primary) hypertension: Secondary | ICD-10-CM | POA: Diagnosis not present

## 2020-12-17 DIAGNOSIS — E7800 Pure hypercholesterolemia, unspecified: Secondary | ICD-10-CM | POA: Diagnosis not present

## 2020-12-24 DIAGNOSIS — J3081 Allergic rhinitis due to animal (cat) (dog) hair and dander: Secondary | ICD-10-CM | POA: Diagnosis not present

## 2020-12-24 DIAGNOSIS — J3089 Other allergic rhinitis: Secondary | ICD-10-CM | POA: Diagnosis not present

## 2020-12-24 DIAGNOSIS — J301 Allergic rhinitis due to pollen: Secondary | ICD-10-CM | POA: Diagnosis not present

## 2020-12-31 DIAGNOSIS — L820 Inflamed seborrheic keratosis: Secondary | ICD-10-CM | POA: Diagnosis not present

## 2020-12-31 DIAGNOSIS — L570 Actinic keratosis: Secondary | ICD-10-CM | POA: Diagnosis not present

## 2020-12-31 DIAGNOSIS — D225 Melanocytic nevi of trunk: Secondary | ICD-10-CM | POA: Diagnosis not present

## 2020-12-31 DIAGNOSIS — L738 Other specified follicular disorders: Secondary | ICD-10-CM | POA: Diagnosis not present

## 2020-12-31 DIAGNOSIS — Z85820 Personal history of malignant melanoma of skin: Secondary | ICD-10-CM | POA: Diagnosis not present

## 2020-12-31 DIAGNOSIS — L821 Other seborrheic keratosis: Secondary | ICD-10-CM | POA: Diagnosis not present

## 2021-01-06 DIAGNOSIS — Z1331 Encounter for screening for depression: Secondary | ICD-10-CM | POA: Diagnosis not present

## 2021-01-06 DIAGNOSIS — Z23 Encounter for immunization: Secondary | ICD-10-CM | POA: Diagnosis not present

## 2021-01-06 DIAGNOSIS — Z1339 Encounter for screening examination for other mental health and behavioral disorders: Secondary | ICD-10-CM | POA: Diagnosis not present

## 2021-01-06 DIAGNOSIS — R82998 Other abnormal findings in urine: Secondary | ICD-10-CM | POA: Diagnosis not present

## 2021-01-06 DIAGNOSIS — I10 Essential (primary) hypertension: Secondary | ICD-10-CM | POA: Diagnosis not present

## 2021-01-06 DIAGNOSIS — Z0000 Encounter for general adult medical examination without abnormal findings: Secondary | ICD-10-CM | POA: Diagnosis not present

## 2021-01-21 DIAGNOSIS — J301 Allergic rhinitis due to pollen: Secondary | ICD-10-CM | POA: Diagnosis not present

## 2021-01-21 DIAGNOSIS — J3081 Allergic rhinitis due to animal (cat) (dog) hair and dander: Secondary | ICD-10-CM | POA: Diagnosis not present

## 2021-01-21 DIAGNOSIS — J3089 Other allergic rhinitis: Secondary | ICD-10-CM | POA: Diagnosis not present

## 2021-01-29 ENCOUNTER — Other Ambulatory Visit: Payer: Self-pay | Admitting: Urology

## 2021-01-29 DIAGNOSIS — D49511 Neoplasm of unspecified behavior of right kidney: Secondary | ICD-10-CM

## 2021-02-12 DIAGNOSIS — J3089 Other allergic rhinitis: Secondary | ICD-10-CM | POA: Diagnosis not present

## 2021-02-12 DIAGNOSIS — J3081 Allergic rhinitis due to animal (cat) (dog) hair and dander: Secondary | ICD-10-CM | POA: Diagnosis not present

## 2021-02-12 DIAGNOSIS — J301 Allergic rhinitis due to pollen: Secondary | ICD-10-CM | POA: Diagnosis not present

## 2021-02-12 DIAGNOSIS — G4489 Other headache syndrome: Secondary | ICD-10-CM | POA: Diagnosis not present

## 2021-02-24 ENCOUNTER — Other Ambulatory Visit: Payer: Self-pay

## 2021-02-24 ENCOUNTER — Other Ambulatory Visit: Payer: Self-pay | Admitting: Urology

## 2021-02-24 ENCOUNTER — Ambulatory Visit
Admission: RE | Admit: 2021-02-24 | Discharge: 2021-02-24 | Disposition: A | Discharge status: Home / Self Care | Payer: BC Managed Care – PPO | Source: Ambulatory Visit | Attending: Urology | Admitting: Urology

## 2021-02-24 DIAGNOSIS — D49511 Neoplasm of unspecified behavior of right kidney: Secondary | ICD-10-CM

## 2021-02-24 DIAGNOSIS — C641 Malignant neoplasm of right kidney, except renal pelvis: Secondary | ICD-10-CM | POA: Diagnosis not present

## 2021-02-24 DIAGNOSIS — Z9889 Other specified postprocedural states: Secondary | ICD-10-CM | POA: Diagnosis not present

## 2021-02-24 DIAGNOSIS — I700 Atherosclerosis of aorta: Secondary | ICD-10-CM | POA: Diagnosis not present

## 2021-02-24 DIAGNOSIS — D49519 Neoplasm of unspecified behavior of unspecified kidney: Secondary | ICD-10-CM

## 2021-02-24 DIAGNOSIS — N281 Cyst of kidney, acquired: Secondary | ICD-10-CM | POA: Diagnosis not present

## 2021-02-24 DIAGNOSIS — K862 Cyst of pancreas: Secondary | ICD-10-CM | POA: Diagnosis not present

## 2021-02-24 IMAGING — CR DG CHEST 2V
2 series · 2 of 2 positions shown · non-contrast
Comparison: Chest x-ray [DATE].

CLINICAL DATA: 65-year-old male with history of right-sided renal
neoplasm status post surgical resection [DATE].

EXAM:
CHEST - 2 VIEW

[w chest pa]
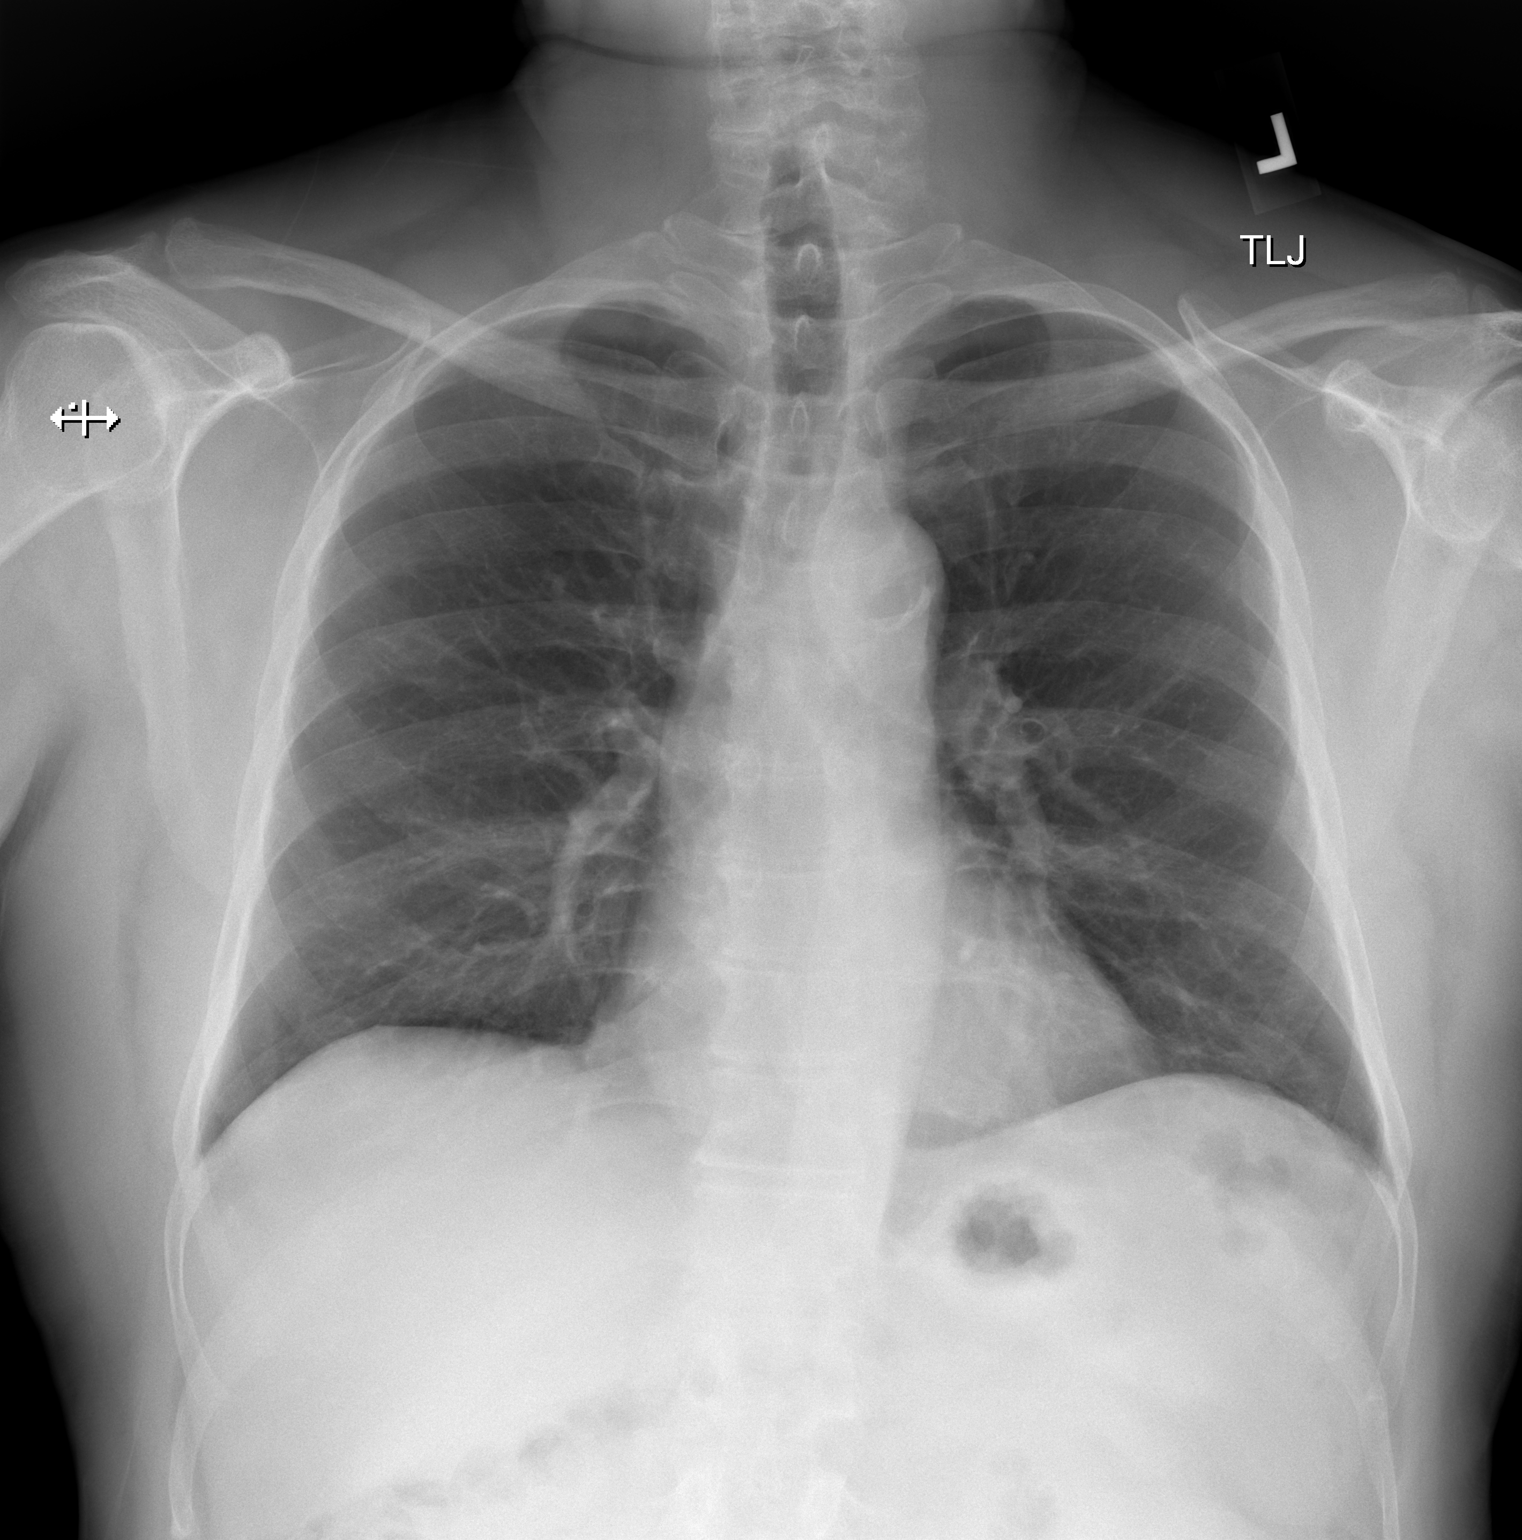

[w chest lat]
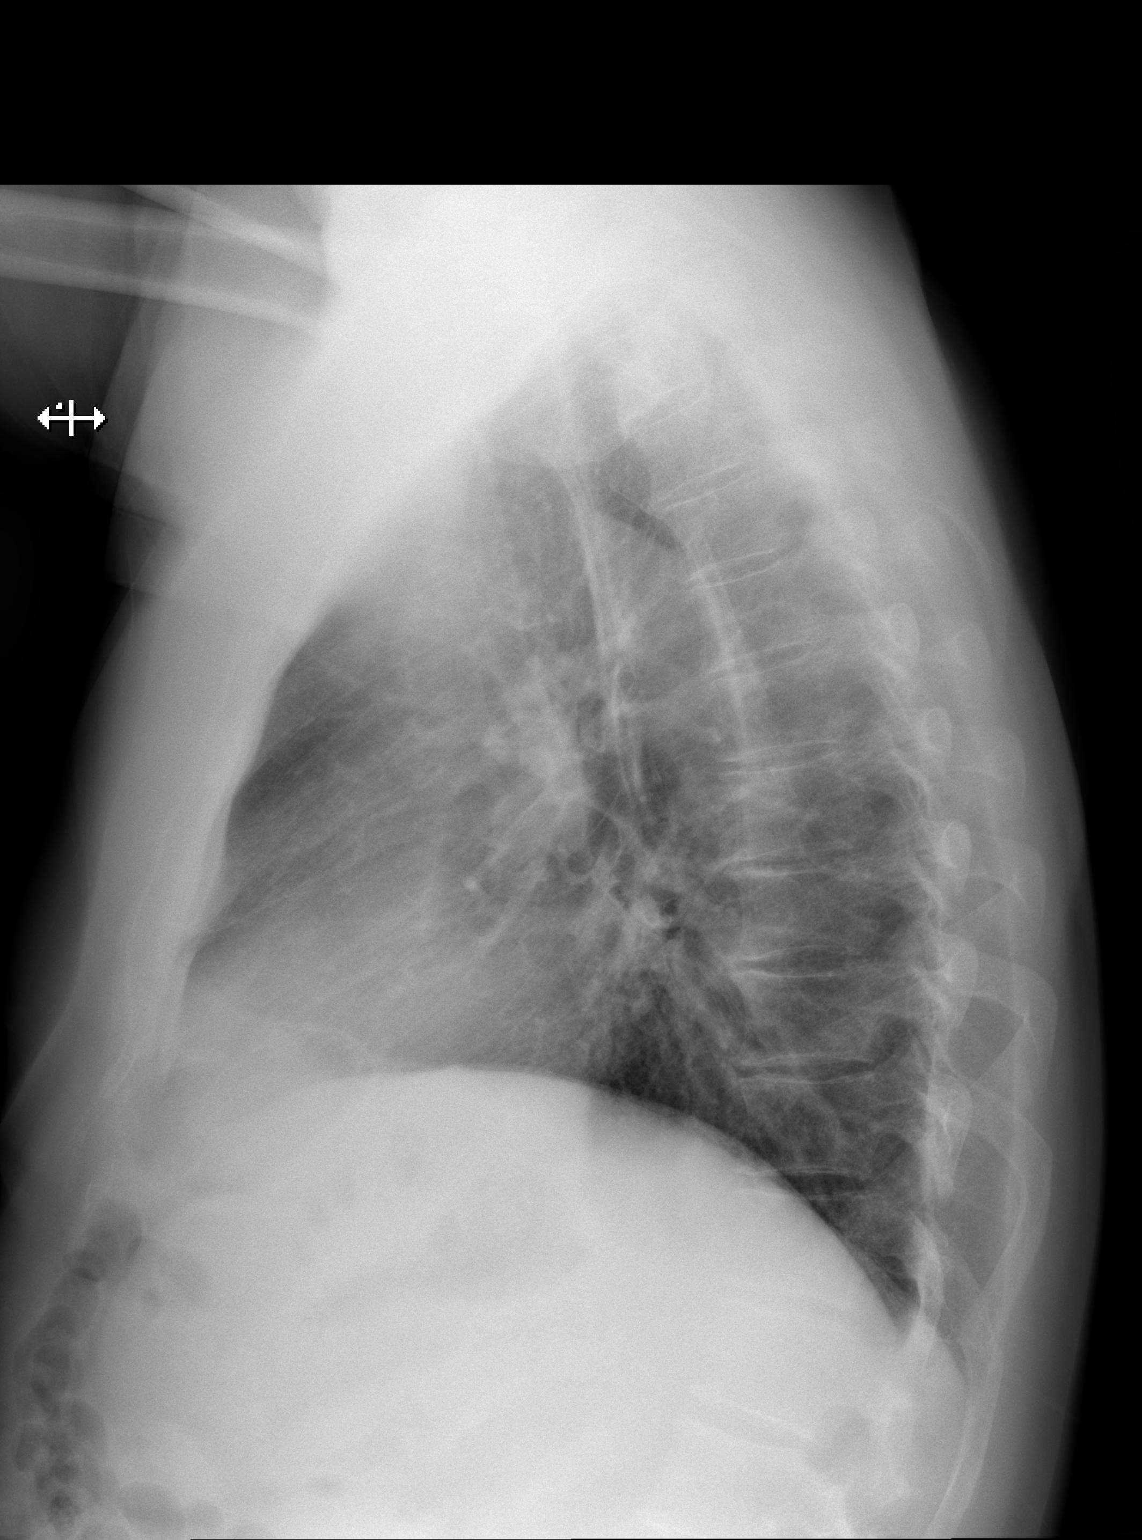

[2 of 2 positions shown; findings below may reference images not displayed]

FINDINGS: Lung volumes are normal. No consolidative airspace disease. No
pleural effusions. No pneumothorax. No pulmonary nodule or mass
noted. Pulmonary vasculature and the cardiomediastinal silhouette
are within normal limits. Atherosclerosis in the thoracic aorta.
IMPRESSION: 1.  No radiographic evidence of acute cardiopulmonary disease.
2. Aortic atherosclerosis.

## 2021-02-24 IMAGING — MR MR ABDOMEN WO/W CM
17 series · 48 of 48 positions shown · IV contrast (multihance)
Comparison: [DATE]

CLINICAL DATA: Follow-up cystic pancreatic lesion and right renal
cell carcinoma. 5 months status post partial right nephrectomy.

EXAM:
MRI ABDOMEN WITHOUT AND WITH CONTRAST
TECHNIQUE: Multiplanar multisequence MR imaging of the abdomen was performed
both before and after the administration of intravenous contrast.
CONTRAST:  15mL MULTIHANCE GADOBENATE DIMEGLUMINE 529 MG/ML IV SOLN

[Series 3: T2 · coronal · 5.0mm · 1.56mm/px · 3 of 36 slices shown (1 of 4)]
[im 1/36]
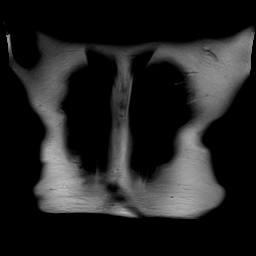
[im 18/36]
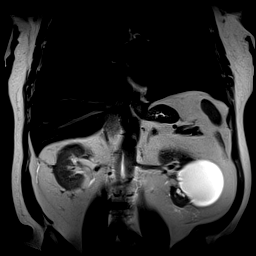
[im 36/36]
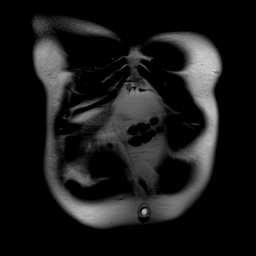

[Series 4: T1 · axial · 3.0mm · 1.19mm/px · z∈[-97,+140]mm · 7 of 160 slices shown]
[im 1/160]
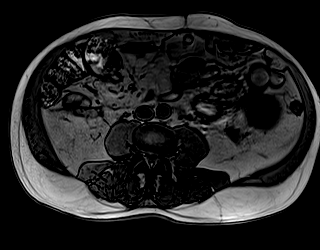
[im 27/160]
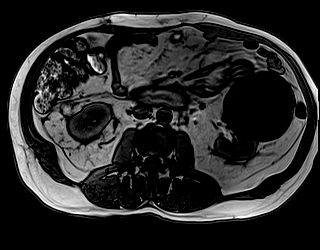
[im 54/160]
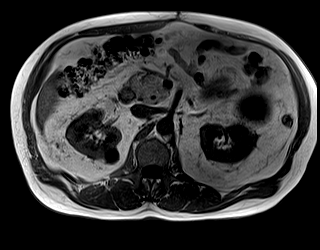
[im 80/160]
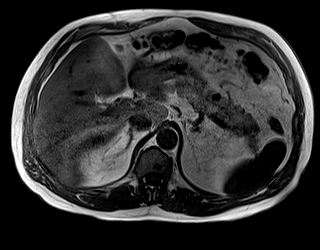
[im 107/160]
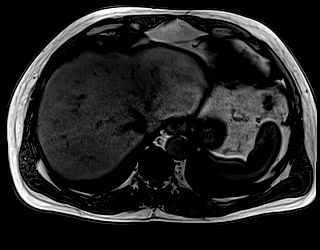
[im 133/160]
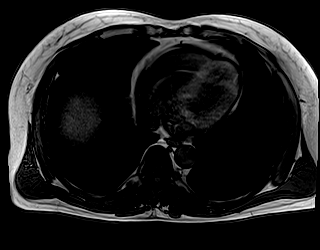
[im 160/160]
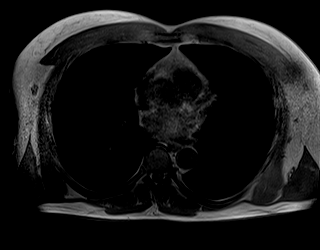

[Series 5: T2 · axial · 5.0mm · 1.48mm/px · z∈[-103,+149]mm · 2 of 43 slices shown (2 of 4)]
[im 1/43]
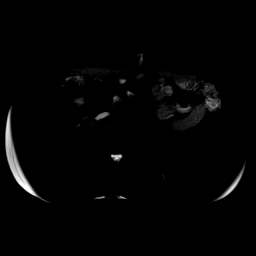
[im 43/43]
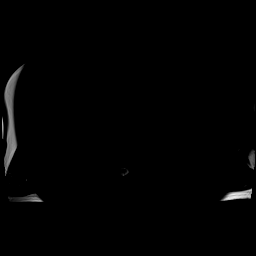

[Series 6: DWI · axial · 5.0mm · 1.42mm/px · z∈[-97,+155]mm · 5 of 129 slices shown (1 of 2)]
[im 1/129]
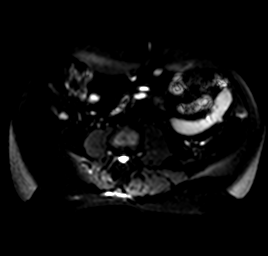
[im 33/129]
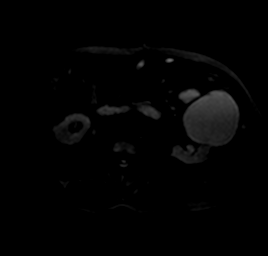
[im 65/129]
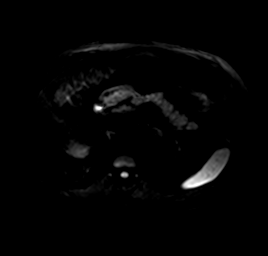
[im 97/129]
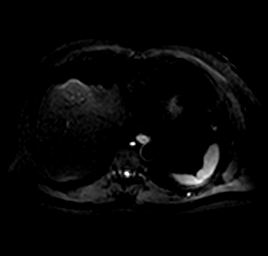
[im 129/129]
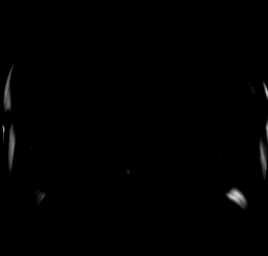

[Series 7: DWI · axial · 5.0mm · 1.42mm/px · z∈[-97,+155]mm · 2 of 43 slices shown (2 of 2)]
[im 1/43]
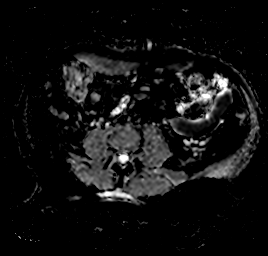
[im 43/43]
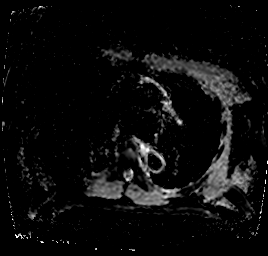

[Series 12: T2 · axial · 6.0mm · 1.22mm/px · 1 of 34 slices shown (3 of 4)]
[im 1/34]
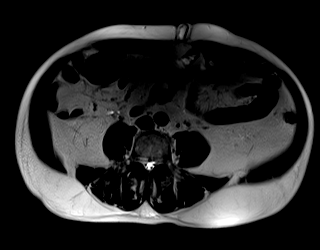

[Series 13: bSSFP · axial · 5.0mm · 1.25mm/px · z∈[-122,+112]mm · 2 of 40 slices shown]
[im 1/40]
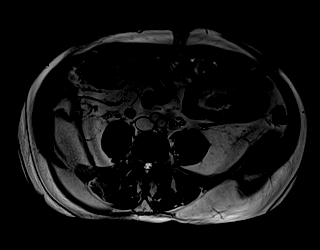
[im 40/40]
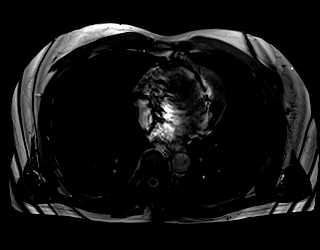

[Series 15: T2 · coronal · 3.0mm · 1.19mm/px · 1 of 22 slices shown (4 of 4)]
[im 1/22]
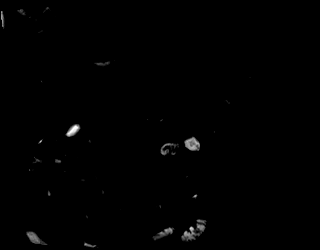

[Series 16: T1 dynamic · axial · non-contrast · 3.0mm · 1.25mm/px · z∈[-122,+115]mm · 3 of 80 slices shown]
[im 1/80]
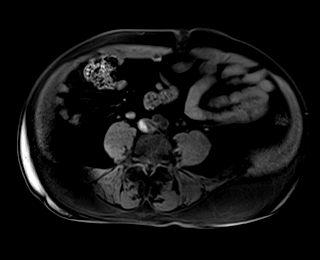
[im 40/80]
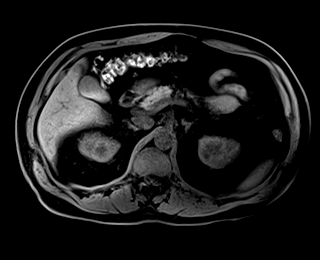
[im 80/80]
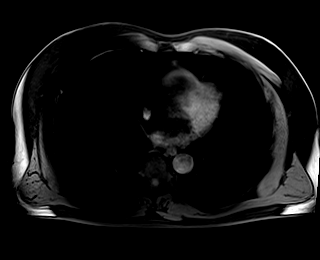

[Series 17: T1 dynamic post-contrast · axial · 3.0mm · 1.25mm/px · z∈[-122,+115]mm · 3 of 80 slices shown (1 of 7)]
[im 1/80]
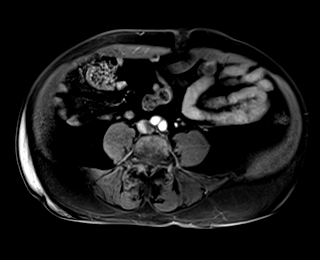
[im 40/80]
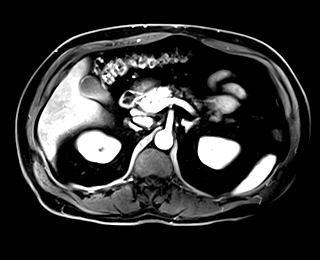
[im 80/80]
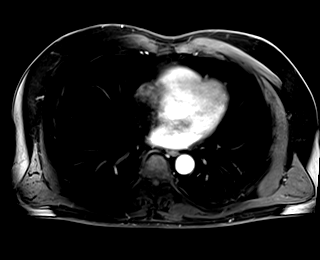

[Series 18: T1 dynamic post-contrast · axial · 3.0mm · 1.25mm/px · z∈[-122,+115]mm · 3 of 80 slices shown (2 of 7)]
[im 1/80]
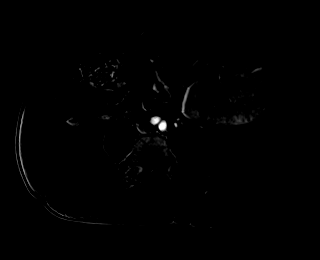
[im 40/80]
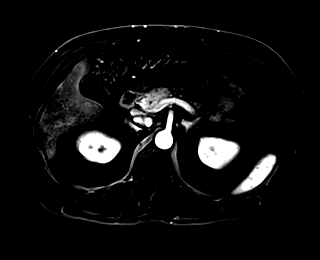
[im 80/80]
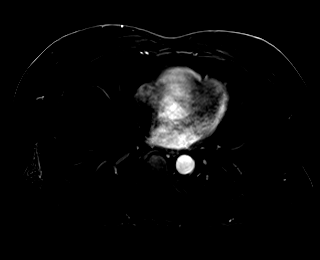

[Series 19: T1 dynamic post-contrast · axial · 3.0mm · 1.25mm/px · z∈[-122,+115]mm · 3 of 80 slices shown (3 of 7)]
[im 1/80]
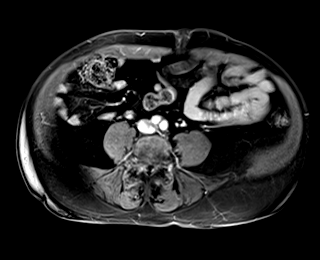
[im 40/80]
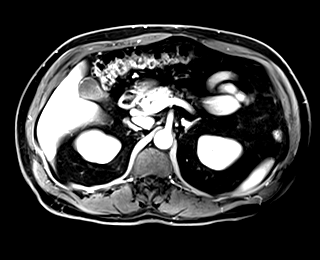
[im 80/80]
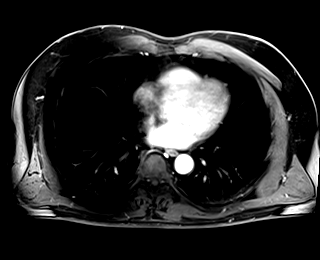

[Series 20: T1 dynamic post-contrast · axial · 3.0mm · 1.25mm/px · z∈[-122,+115]mm · 3 of 80 slices shown (4 of 7)]
[im 1/80]
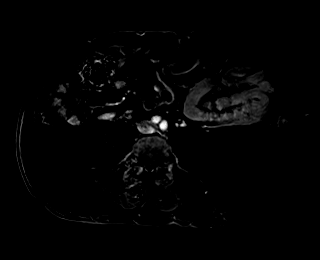
[im 40/80]
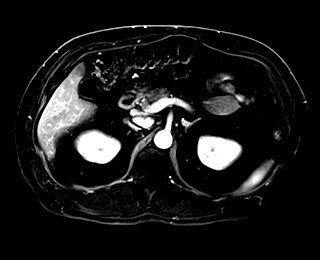
[im 80/80]
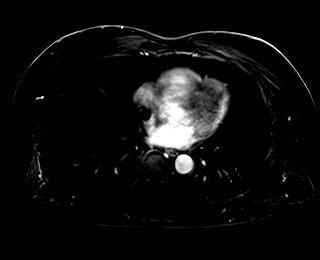

[Series 21: T1 dynamic post-contrast · axial · 3.0mm · 1.25mm/px · z∈[-122,+115]mm · 3 of 80 slices shown (5 of 7)]
[im 1/80]
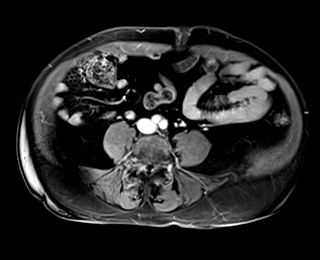
[im 40/80]
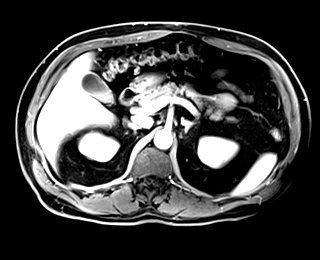
[im 80/80]
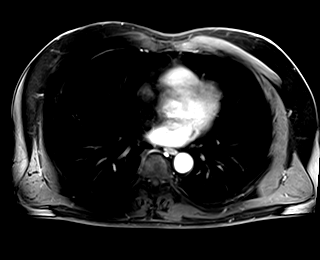

[Series 22: T1 dynamic post-contrast · axial · 3.0mm · 1.25mm/px · z∈[-122,+115]mm · 3 of 80 slices shown (6 of 7)]
[im 1/80]
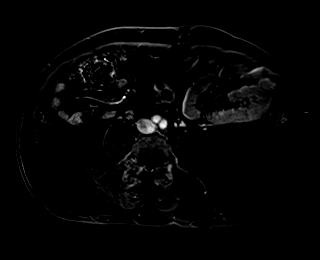
[im 40/80]
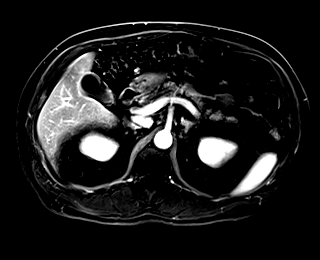
[im 80/80]
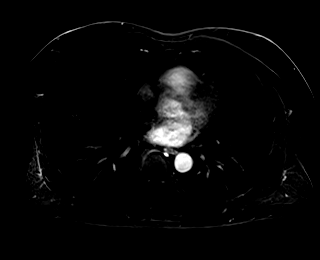

[Series 23: T1 dynamic post-contrast · coronal · 3.0mm · 1.25mm/px · 3 of 72 slices shown (7 of 7)]
[im 1/72]
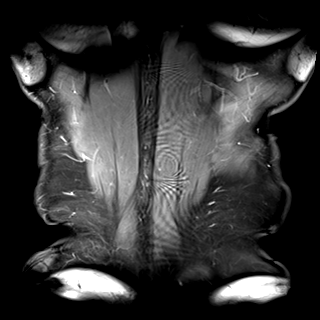
[im 36/72]
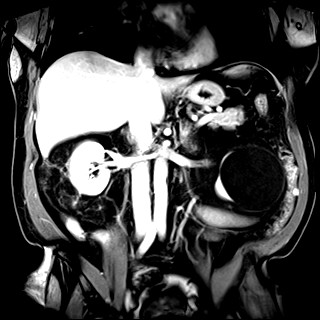
[im 72/72]
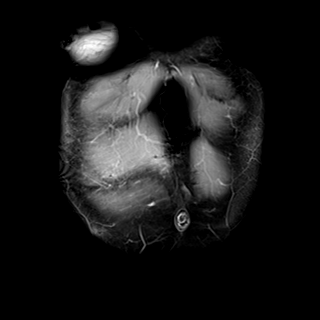

[Series 100: MRCP · sagittal · 0.49mm/px · 1 of 7 slices shown]
[im 1/7]
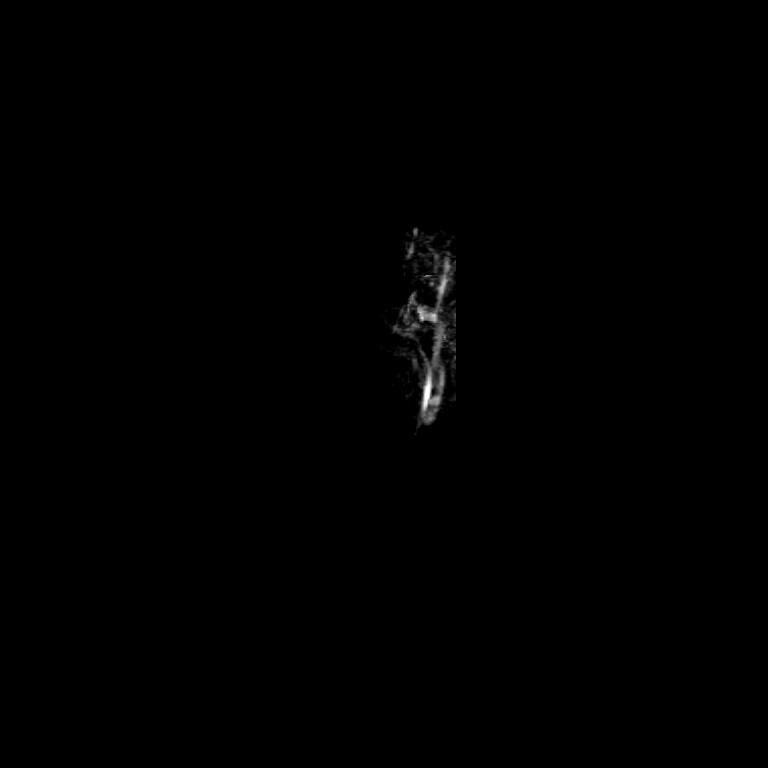

[48 of 48 positions shown; findings below may reference images not displayed]

FINDINGS: Lower chest: No acute findings.

Hepatobiliary: No hepatic masses identified. Gallbladder is
unremarkable. No evidence of biliary ductal dilatation.

Pancreas: 9 mm simple cystic lesion is again seen in the pancreatic
tail which shows communication with the main pancreatic duct. This
is stable and there is no evidence of pancreatic ductal dilatation.
No evidence of peripancreatic inflammatory changes or fluid
collections.

Spleen:  Within normal limits in size and appearance.

Adrenals/Urinary Tract: Normal adrenal glands. Postop changes from
partial right nephrectomy are seen along the lateral aspect of the
right kidney. No evidence of residual or recurrent renal mass.
Simple bilateral renal cysts are stable, largest in the left kidney
measuring 8.7 cm. No evidence of hydronephrosis.

Stomach/Bowel: Visualized portion unremarkable.

Vascular/Lymphatic: No pathologically enlarged lymph nodes
identified. No acute vascular findings.

Other:  None.

Musculoskeletal:  No suspicious bone lesions identified.
IMPRESSION: Expected postop changes from partial right nephrectomy. No evidence
of residual or recurrent renal mass. No evidence of abdominal
metastatic disease.

Stable 9 mm cystic lesion in the pancreatic tail, most likely an
indolent cystic neoplasm such as a side branch intraductal papillary
mucinous neoplasm. Recommend continued follow-up by MRI in 1 year.
(Based on [HOSPITAL] and [facility] Guidelines,

## 2021-02-24 MED ORDER — GADOBENATE DIMEGLUMINE 529 MG/ML IV SOLN
15.0000 mL | Freq: Once | INTRAVENOUS | Status: AC | PRN
  Administered 2021-02-24: 15 mL via INTRAVENOUS

## 2021-03-05 DIAGNOSIS — R519 Headache, unspecified: Secondary | ICD-10-CM | POA: Diagnosis not present

## 2021-03-05 DIAGNOSIS — J3489 Other specified disorders of nose and nasal sinuses: Secondary | ICD-10-CM | POA: Diagnosis not present

## 2021-03-05 DIAGNOSIS — H9319 Tinnitus, unspecified ear: Secondary | ICD-10-CM | POA: Diagnosis not present

## 2021-03-11 DIAGNOSIS — J3081 Allergic rhinitis due to animal (cat) (dog) hair and dander: Secondary | ICD-10-CM | POA: Diagnosis not present

## 2021-03-11 DIAGNOSIS — J3089 Other allergic rhinitis: Secondary | ICD-10-CM | POA: Diagnosis not present

## 2021-03-11 DIAGNOSIS — J301 Allergic rhinitis due to pollen: Secondary | ICD-10-CM | POA: Diagnosis not present

## 2021-03-14 DIAGNOSIS — D49511 Neoplasm of unspecified behavior of right kidney: Secondary | ICD-10-CM | POA: Diagnosis not present

## 2021-03-14 DIAGNOSIS — R351 Nocturia: Secondary | ICD-10-CM | POA: Diagnosis not present

## 2021-03-14 DIAGNOSIS — R972 Elevated prostate specific antigen [PSA]: Secondary | ICD-10-CM | POA: Diagnosis not present

## 2021-03-14 DIAGNOSIS — R3912 Poor urinary stream: Secondary | ICD-10-CM | POA: Diagnosis not present

## 2021-03-14 DIAGNOSIS — N401 Enlarged prostate with lower urinary tract symptoms: Secondary | ICD-10-CM | POA: Diagnosis not present

## 2021-04-08 DIAGNOSIS — J301 Allergic rhinitis due to pollen: Secondary | ICD-10-CM | POA: Diagnosis not present

## 2021-04-08 DIAGNOSIS — J3081 Allergic rhinitis due to animal (cat) (dog) hair and dander: Secondary | ICD-10-CM | POA: Diagnosis not present

## 2021-04-08 DIAGNOSIS — J3089 Other allergic rhinitis: Secondary | ICD-10-CM | POA: Diagnosis not present

## 2021-05-05 DIAGNOSIS — H9312 Tinnitus, left ear: Secondary | ICD-10-CM | POA: Diagnosis not present

## 2021-05-05 DIAGNOSIS — J343 Hypertrophy of nasal turbinates: Secondary | ICD-10-CM | POA: Diagnosis not present

## 2021-05-05 DIAGNOSIS — H903 Sensorineural hearing loss, bilateral: Secondary | ICD-10-CM | POA: Diagnosis not present

## 2021-05-05 DIAGNOSIS — J309 Allergic rhinitis, unspecified: Secondary | ICD-10-CM | POA: Diagnosis not present

## 2021-05-06 DIAGNOSIS — J3081 Allergic rhinitis due to animal (cat) (dog) hair and dander: Secondary | ICD-10-CM | POA: Diagnosis not present

## 2021-05-06 DIAGNOSIS — J301 Allergic rhinitis due to pollen: Secondary | ICD-10-CM | POA: Diagnosis not present

## 2021-05-06 DIAGNOSIS — J3089 Other allergic rhinitis: Secondary | ICD-10-CM | POA: Diagnosis not present

## 2021-06-03 DIAGNOSIS — J3081 Allergic rhinitis due to animal (cat) (dog) hair and dander: Secondary | ICD-10-CM | POA: Diagnosis not present

## 2021-06-03 DIAGNOSIS — J3089 Other allergic rhinitis: Secondary | ICD-10-CM | POA: Diagnosis not present

## 2021-06-03 DIAGNOSIS — J301 Allergic rhinitis due to pollen: Secondary | ICD-10-CM | POA: Diagnosis not present

## 2021-07-01 DIAGNOSIS — J3081 Allergic rhinitis due to animal (cat) (dog) hair and dander: Secondary | ICD-10-CM | POA: Diagnosis not present

## 2021-07-01 DIAGNOSIS — J3089 Other allergic rhinitis: Secondary | ICD-10-CM | POA: Diagnosis not present

## 2021-07-01 DIAGNOSIS — J301 Allergic rhinitis due to pollen: Secondary | ICD-10-CM | POA: Diagnosis not present

## 2021-07-03 DIAGNOSIS — L821 Other seborrheic keratosis: Secondary | ICD-10-CM | POA: Diagnosis not present

## 2021-07-03 DIAGNOSIS — L814 Other melanin hyperpigmentation: Secondary | ICD-10-CM | POA: Diagnosis not present

## 2021-07-03 DIAGNOSIS — L570 Actinic keratosis: Secondary | ICD-10-CM | POA: Diagnosis not present

## 2021-07-03 DIAGNOSIS — L111 Transient acantholytic dermatosis [Grover]: Secondary | ICD-10-CM | POA: Diagnosis not present

## 2021-07-03 DIAGNOSIS — Z85820 Personal history of malignant melanoma of skin: Secondary | ICD-10-CM | POA: Diagnosis not present

## 2021-07-15 DIAGNOSIS — J3089 Other allergic rhinitis: Secondary | ICD-10-CM | POA: Diagnosis not present

## 2021-07-15 DIAGNOSIS — J301 Allergic rhinitis due to pollen: Secondary | ICD-10-CM | POA: Diagnosis not present

## 2021-07-15 DIAGNOSIS — J3081 Allergic rhinitis due to animal (cat) (dog) hair and dander: Secondary | ICD-10-CM | POA: Diagnosis not present

## 2021-07-31 DIAGNOSIS — J3081 Allergic rhinitis due to animal (cat) (dog) hair and dander: Secondary | ICD-10-CM | POA: Diagnosis not present

## 2021-07-31 DIAGNOSIS — J301 Allergic rhinitis due to pollen: Secondary | ICD-10-CM | POA: Diagnosis not present

## 2021-07-31 DIAGNOSIS — J3089 Other allergic rhinitis: Secondary | ICD-10-CM | POA: Diagnosis not present

## 2021-08-19 DIAGNOSIS — J301 Allergic rhinitis due to pollen: Secondary | ICD-10-CM | POA: Diagnosis not present

## 2021-08-19 DIAGNOSIS — J3089 Other allergic rhinitis: Secondary | ICD-10-CM | POA: Diagnosis not present

## 2021-08-19 DIAGNOSIS — J3081 Allergic rhinitis due to animal (cat) (dog) hair and dander: Secondary | ICD-10-CM | POA: Diagnosis not present

## 2021-08-19 DIAGNOSIS — G4489 Other headache syndrome: Secondary | ICD-10-CM | POA: Diagnosis not present

## 2021-09-15 DIAGNOSIS — L723 Sebaceous cyst: Secondary | ICD-10-CM | POA: Diagnosis not present

## 2021-09-16 DIAGNOSIS — J3081 Allergic rhinitis due to animal (cat) (dog) hair and dander: Secondary | ICD-10-CM | POA: Diagnosis not present

## 2021-09-16 DIAGNOSIS — J3089 Other allergic rhinitis: Secondary | ICD-10-CM | POA: Diagnosis not present

## 2021-09-16 DIAGNOSIS — J301 Allergic rhinitis due to pollen: Secondary | ICD-10-CM | POA: Diagnosis not present

## 2021-09-26 DIAGNOSIS — D485 Neoplasm of uncertain behavior of skin: Secondary | ICD-10-CM | POA: Diagnosis not present

## 2021-09-26 DIAGNOSIS — L820 Inflamed seborrheic keratosis: Secondary | ICD-10-CM | POA: Diagnosis not present

## 2021-09-26 DIAGNOSIS — D170 Benign lipomatous neoplasm of skin and subcutaneous tissue of head, face and neck: Secondary | ICD-10-CM | POA: Diagnosis not present

## 2021-10-14 DIAGNOSIS — J301 Allergic rhinitis due to pollen: Secondary | ICD-10-CM | POA: Diagnosis not present

## 2021-10-14 DIAGNOSIS — J3081 Allergic rhinitis due to animal (cat) (dog) hair and dander: Secondary | ICD-10-CM | POA: Diagnosis not present

## 2021-10-14 DIAGNOSIS — J3089 Other allergic rhinitis: Secondary | ICD-10-CM | POA: Diagnosis not present

## 2021-11-11 ENCOUNTER — Telehealth: Payer: Self-pay | Admitting: *Deleted

## 2021-11-11 NOTE — Chronic Care Management (AMB) (Signed)
  Care Coordination   Note   11/11/2021 Name: Adam Noble MRN: [id number] DOB: March 13, 1956  [name] is a 66 y.o. year old male who sees Tisovec, Fransico Him, MD for primary care. I reached out to [name] by phone today to offer care coordination services.  [name] was given information about Care Coordination services today including:   The Care Coordination services include support from the care team which includes your Nurse Coordinator, Clinical Social Worker, or Pharmacist.  The Care Coordination team is here to help remove barriers to the health concerns and goals most important to you. Care Coordination services are voluntary, and the patient may decline or stop services at any time by request to their care team member.   Care Coordination Consent Status: Patient did not agree to participate in care coordination services at this time.    Encounter Outcome:  Pt. Refused  [place]  Direct Dial: [phone]

## 2021-11-13 DIAGNOSIS — M25512 Pain in left shoulder: Secondary | ICD-10-CM | POA: Diagnosis not present

## 2021-11-13 DIAGNOSIS — M25561 Pain in right knee: Secondary | ICD-10-CM | POA: Diagnosis not present

## 2021-11-13 DIAGNOSIS — J3089 Other allergic rhinitis: Secondary | ICD-10-CM | POA: Diagnosis not present

## 2021-11-13 DIAGNOSIS — J301 Allergic rhinitis due to pollen: Secondary | ICD-10-CM | POA: Diagnosis not present

## 2021-11-13 DIAGNOSIS — J3081 Allergic rhinitis due to animal (cat) (dog) hair and dander: Secondary | ICD-10-CM | POA: Diagnosis not present

## 2021-11-25 DIAGNOSIS — L820 Inflamed seborrheic keratosis: Secondary | ICD-10-CM | POA: Diagnosis not present

## 2021-11-25 DIAGNOSIS — D225 Melanocytic nevi of trunk: Secondary | ICD-10-CM | POA: Diagnosis not present

## 2021-11-25 DIAGNOSIS — L738 Other specified follicular disorders: Secondary | ICD-10-CM | POA: Diagnosis not present

## 2021-11-25 DIAGNOSIS — L570 Actinic keratosis: Secondary | ICD-10-CM | POA: Diagnosis not present

## 2021-11-25 DIAGNOSIS — D2272 Melanocytic nevi of left lower limb, including hip: Secondary | ICD-10-CM | POA: Diagnosis not present

## 2021-11-25 DIAGNOSIS — D224 Melanocytic nevi of scalp and neck: Secondary | ICD-10-CM | POA: Diagnosis not present

## 2021-12-01 DIAGNOSIS — D171 Benign lipomatous neoplasm of skin and subcutaneous tissue of trunk: Secondary | ICD-10-CM | POA: Diagnosis not present

## 2021-12-09 DIAGNOSIS — J3089 Other allergic rhinitis: Secondary | ICD-10-CM | POA: Diagnosis not present

## 2021-12-09 DIAGNOSIS — J3081 Allergic rhinitis due to animal (cat) (dog) hair and dander: Secondary | ICD-10-CM | POA: Diagnosis not present

## 2021-12-09 DIAGNOSIS — J301 Allergic rhinitis due to pollen: Secondary | ICD-10-CM | POA: Diagnosis not present

## 2022-01-06 DIAGNOSIS — J3081 Allergic rhinitis due to animal (cat) (dog) hair and dander: Secondary | ICD-10-CM | POA: Diagnosis not present

## 2022-01-06 DIAGNOSIS — Z125 Encounter for screening for malignant neoplasm of prostate: Secondary | ICD-10-CM | POA: Diagnosis not present

## 2022-01-06 DIAGNOSIS — I10 Essential (primary) hypertension: Secondary | ICD-10-CM | POA: Diagnosis not present

## 2022-01-06 DIAGNOSIS — J3089 Other allergic rhinitis: Secondary | ICD-10-CM | POA: Diagnosis not present

## 2022-01-06 DIAGNOSIS — J301 Allergic rhinitis due to pollen: Secondary | ICD-10-CM | POA: Diagnosis not present

## 2022-01-19 DIAGNOSIS — Z1212 Encounter for screening for malignant neoplasm of rectum: Secondary | ICD-10-CM | POA: Diagnosis not present

## 2022-01-20 DIAGNOSIS — Z0000 Encounter for general adult medical examination without abnormal findings: Secondary | ICD-10-CM | POA: Diagnosis not present

## 2022-01-20 DIAGNOSIS — I10 Essential (primary) hypertension: Secondary | ICD-10-CM | POA: Diagnosis not present

## 2022-01-20 DIAGNOSIS — Z23 Encounter for immunization: Secondary | ICD-10-CM | POA: Diagnosis not present

## 2022-01-20 DIAGNOSIS — R82998 Other abnormal findings in urine: Secondary | ICD-10-CM | POA: Diagnosis not present

## 2022-01-20 DIAGNOSIS — Z1331 Encounter for screening for depression: Secondary | ICD-10-CM | POA: Diagnosis not present

## 2022-01-20 DIAGNOSIS — Z1339 Encounter for screening examination for other mental health and behavioral disorders: Secondary | ICD-10-CM | POA: Diagnosis not present

## 2022-01-21 ENCOUNTER — Other Ambulatory Visit: Payer: Self-pay | Admitting: Urology

## 2022-01-21 DIAGNOSIS — D49511 Neoplasm of unspecified behavior of right kidney: Secondary | ICD-10-CM

## 2022-01-28 ENCOUNTER — Encounter (HOSPITAL_BASED_OUTPATIENT_CLINIC_OR_DEPARTMENT_OTHER): Payer: Self-pay | Admitting: Surgery

## 2022-01-28 NOTE — Progress Notes (Signed)
Spoke w/ via phone for pre-op interview--- [employer]---- ISTAT and EKG              Lab results------ COVID test -----patient states asymptomatic no test needed Arrive at -------1330 NPO after MN NO Solid Food.  Clear liquids from MN until---1230 Med rec completed Medications to take morning of surgery -----NONE Diabetic medication ----- Patient instructed no nail polish to be worn day of surgery Patient instructed to bring photo id and insurance card day of surgery Patient aware to have Driver (ride ) / caregiver  Adam Noble Wife  for 24 hours after surgery  Patient Special Instructions ----- Pre-Op special Istructions ----- Patient verbalized understanding of instructions that were given at this phone interview. Patient denies shortness of breath, chest pain, fever, cough at this phone interview.

## 2022-01-29 ENCOUNTER — Ambulatory Visit: Payer: Self-pay | Admitting: Surgery

## 2022-01-29 DIAGNOSIS — R051 Acute cough: Secondary | ICD-10-CM | POA: Diagnosis not present

## 2022-01-29 DIAGNOSIS — J069 Acute upper respiratory infection, unspecified: Secondary | ICD-10-CM | POA: Diagnosis not present

## 2022-02-02 ENCOUNTER — Ambulatory Visit (HOSPITAL_BASED_OUTPATIENT_CLINIC_OR_DEPARTMENT_OTHER)
Admission: RE | Admit: 2022-02-02 | Discharge: 2022-02-02 | Disposition: A | Discharge status: Home / Self Care | Payer: BC Managed Care – PPO | Attending: Surgery | Admitting: Surgery

## 2022-02-02 ENCOUNTER — Ambulatory Visit (HOSPITAL_BASED_OUTPATIENT_CLINIC_OR_DEPARTMENT_OTHER): Payer: BC Managed Care – PPO | Admitting: Anesthesiology

## 2022-02-02 ENCOUNTER — Encounter (HOSPITAL_BASED_OUTPATIENT_CLINIC_OR_DEPARTMENT_OTHER)
Admission: RE | Disposition: A | Discharge status: Home / Self Care | Payer: Self-pay | Source: Home / Self Care | Attending: Surgery

## 2022-02-02 ENCOUNTER — Encounter (HOSPITAL_BASED_OUTPATIENT_CLINIC_OR_DEPARTMENT_OTHER): Payer: Self-pay | Admitting: Surgery

## 2022-02-02 ENCOUNTER — Other Ambulatory Visit: Payer: Self-pay

## 2022-02-02 DIAGNOSIS — D171 Benign lipomatous neoplasm of skin and subcutaneous tissue of trunk: Secondary | ICD-10-CM | POA: Diagnosis not present

## 2022-02-02 DIAGNOSIS — I10 Essential (primary) hypertension: Secondary | ICD-10-CM | POA: Diagnosis not present

## 2022-02-02 DIAGNOSIS — Z905 Acquired absence of kidney: Secondary | ICD-10-CM | POA: Insufficient documentation

## 2022-02-02 HISTORY — PX: LIPOMA EXCISION: SHX5283

## 2022-02-02 LAB — POCT I-STAT, CHEM 8
BUN: 19 mg/dL (ref 8–23)
Calcium, Ion: 1.25 mmol/L (ref 1.15–1.40)
Chloride: 100 mmol/L (ref 98–111)
Creatinine, Ser: 1.1 mg/dL (ref 0.61–1.24)
Glucose, Bld: 77 mg/dL (ref 70–99)
HCT: 48 % (ref 39.0–52.0)
Hemoglobin: 16.3 g/dL (ref 13.0–17.0)
Potassium: 4.5 mmol/L (ref 3.5–5.1)
Sodium: 140 mmol/L (ref 135–145)
TCO2: 29 mmol/L (ref 22–32)

## 2022-02-02 SURGERY — EXCISION LIPOMA
Anesthesia: Monitor Anesthesia Care | Site: Back | Laterality: Left

## 2022-02-02 MED ORDER — CHLORHEXIDINE GLUCONATE CLOTH 2 % EX PADS: 6.0000 | MEDICATED_PAD | Freq: Once | CUTANEOUS | Status: DC

## 2022-02-02 MED ORDER — MIDAZOLAM HCL 2 MG/2ML IJ SOLN
INTRAMUSCULAR | Status: AC
  Filled 2022-02-02: qty 2

## 2022-02-02 MED ORDER — ACETAMINOPHEN 500 MG PO TABS: 1000.0000 mg | ORAL_TABLET | ORAL | Status: DC

## 2022-02-02 MED ORDER — BUPIVACAINE-EPINEPHRINE 0.25% -1:200000 IJ SOLN
INTRAMUSCULAR | Status: DC | PRN
  Administered 2022-02-02: 30 mL

## 2022-02-02 MED ORDER — CEFAZOLIN SODIUM-DEXTROSE 2-4 GM/100ML-% IV SOLN
2.0000 g | INTRAVENOUS | Status: AC
  Administered 2022-02-02: 2 g via INTRAVENOUS

## 2022-02-02 MED ORDER — OXYCODONE HCL 5 MG/5ML PO SOLN: 5.0000 mg | Freq: Once | ORAL | Status: DC | PRN

## 2022-02-02 MED ORDER — LACTATED RINGERS IV SOLN: INTRAVENOUS | Status: DC

## 2022-02-02 MED ORDER — FENTANYL CITRATE (PF) 100 MCG/2ML IJ SOLN
INTRAMUSCULAR | Status: DC | PRN
  Administered 2022-02-02: 50 ug via INTRAVENOUS

## 2022-02-02 MED ORDER — LIDOCAINE HCL (CARDIAC) PF 100 MG/5ML IV SOSY
PREFILLED_SYRINGE | INTRAVENOUS | Status: DC | PRN
  Administered 2022-02-02: 50 mg via INTRAVENOUS

## 2022-02-02 MED ORDER — PROPOFOL 500 MG/50ML IV EMUL
INTRAVENOUS | Status: DC | PRN
  Administered 2022-02-02 (×3): 10 mg via INTRAVENOUS

## 2022-02-02 MED ORDER — ACETAMINOPHEN 500 MG PO TABS
1000.0000 mg | ORAL_TABLET | ORAL | Status: AC
  Administered 2022-02-02: 1000 mg via ORAL

## 2022-02-02 MED ORDER — ACETAMINOPHEN 500 MG PO TABS
ORAL_TABLET | ORAL | Status: AC
  Filled 2022-02-02: qty 2

## 2022-02-02 MED ORDER — PROPOFOL 10 MG/ML IV BOLUS
INTRAVENOUS | Status: DC | PRN
  Administered 2022-02-02: 100 ug/kg/min via INTRAVENOUS

## 2022-02-02 MED ORDER — OXYCODONE HCL 5 MG PO TABS: 5.0000 mg | ORAL_TABLET | Freq: Once | ORAL | Status: DC | PRN

## 2022-02-02 MED ORDER — GABAPENTIN 300 MG PO CAPS
ORAL_CAPSULE | ORAL | Status: AC
  Filled 2022-02-02: qty 1

## 2022-02-02 MED ORDER — ACETAMINOPHEN 10 MG/ML IV SOLN: 1000.0000 mg | Freq: Once | INTRAVENOUS | Status: DC | PRN

## 2022-02-02 MED ORDER — 0.9 % SODIUM CHLORIDE (POUR BTL) OPTIME
TOPICAL | Status: DC | PRN
  Administered 2022-02-02: 500 mL

## 2022-02-02 MED ORDER — PROPOFOL 500 MG/50ML IV EMUL
INTRAVENOUS | Status: AC
  Filled 2022-02-02: qty 50

## 2022-02-02 MED ORDER — ONDANSETRON HCL 4 MG/2ML IJ SOLN
INTRAMUSCULAR | Status: DC | PRN
  Administered 2022-02-02: 4 mg via INTRAVENOUS

## 2022-02-02 MED ORDER — ACETAMINOPHEN 160 MG/5ML PO SOLN: 1000.0000 mg | Freq: Once | ORAL | Status: DC | PRN

## 2022-02-02 MED ORDER — ACETAMINOPHEN 500 MG PO TABS: 1000.0000 mg | ORAL_TABLET | Freq: Once | ORAL | Status: DC | PRN

## 2022-02-02 MED ORDER — GABAPENTIN 300 MG PO CAPS: 300.0000 mg | ORAL_CAPSULE | ORAL | Status: DC

## 2022-02-02 MED ORDER — GABAPENTIN 300 MG PO CAPS
300.0000 mg | ORAL_CAPSULE | ORAL | Status: AC
  Administered 2022-02-02: 300 mg via ORAL

## 2022-02-02 MED ORDER — CEFAZOLIN SODIUM-DEXTROSE 2-4 GM/100ML-% IV SOLN
INTRAVENOUS | Status: AC
  Filled 2022-02-02: qty 100

## 2022-02-02 MED ORDER — MIDAZOLAM HCL 2 MG/2ML IJ SOLN
INTRAMUSCULAR | Status: DC | PRN
  Administered 2022-02-02: 2 mg via INTRAVENOUS

## 2022-02-02 MED ORDER — LIDOCAINE HCL (PF) 2 % IJ SOLN
INTRAMUSCULAR | Status: AC
  Filled 2022-02-02: qty 10

## 2022-02-02 MED ORDER — TRAMADOL HCL 50 MG PO TABS: 50.0000 mg | ORAL_TABLET | Freq: Four times a day (QID) | ORAL | 0 refills | Status: AC | PRN

## 2022-02-02 MED ORDER — FENTANYL CITRATE (PF) 100 MCG/2ML IJ SOLN
INTRAMUSCULAR | Status: AC
  Filled 2022-02-02: qty 2

## 2022-02-02 MED ORDER — FENTANYL CITRATE (PF) 100 MCG/2ML IJ SOLN: 25.0000 ug | INTRAMUSCULAR | Status: DC | PRN

## 2022-02-02 SURGICAL SUPPLY — 36 items
ADH SKN CLS APL DERMABOND .7 (GAUZE/BANDAGES/DRESSINGS) ×1
APL PRP STRL LF DISP 70% ISPRP (MISCELLANEOUS) ×1
BLADE SURG 15 STRL LF DISP TIS (BLADE) ×1 IMPLANT
BLADE SURG 15 STRL SS (BLADE) ×1
CHLORAPREP W/TINT 26 (MISCELLANEOUS) IMPLANT
COVER BACK TABLE 60X90IN (DRAPES) ×1 IMPLANT
COVER MAYO STAND STRL (DRAPES) ×1 IMPLANT
DERMABOND ADVANCED .7 DNX12 (GAUZE/BANDAGES/DRESSINGS) IMPLANT
DRAPE LAPAROTOMY 100X72 PEDS (DRAPES) IMPLANT
DRAPE UTILITY XL STRL (DRAPES) ×1 IMPLANT
ELECT REM PT RETURN 9FT ADLT (ELECTROSURGICAL) ×1
ELECTRODE REM PT RTRN 9FT ADLT (ELECTROSURGICAL) ×1 IMPLANT
GAUZE 4X4 16PLY ~~LOC~~+RFID DBL (SPONGE) ×1 IMPLANT
GAUZE SPONGE 4X4 12PLY STRL (GAUZE/BANDAGES/DRESSINGS) ×1 IMPLANT
GLOVE BIOGEL PI IND STRL 6 (GLOVE) IMPLANT
GLOVE BIOGEL PI IND STRL 6.5 (GLOVE) IMPLANT
GLOVE BIOGEL PI IND STRL 8 (GLOVE) ×1 IMPLANT
GLOVE SURG PR MICRO ENCORE 7.5 (GLOVE) ×1 IMPLANT
GLOVE SURG SS PI 6.5 STRL IVOR (GLOVE) IMPLANT
GOWN STRL REUS W/ TWL LRG LVL3 (GOWN DISPOSABLE) IMPLANT
GOWN STRL REUS W/TWL LRG LVL3 (GOWN DISPOSABLE) ×2 IMPLANT
KIT TURNOVER CYSTO (KITS) ×1 IMPLANT
NDL HYPO 25X1 1.5 SAFETY (NEEDLE) IMPLANT
NEEDLE HYPO 25X1 1.5 SAFETY (NEEDLE) ×1 IMPLANT
NS IRRIG 500ML POUR BTL (IV SOLUTION) IMPLANT
PACK BASIN DAY SURGERY FS (CUSTOM PROCEDURE TRAY) ×1 IMPLANT
PENCIL SMOKE EVACUATOR (MISCELLANEOUS) ×1 IMPLANT
SUT MNCRL AB 4-0 PS2 18 (SUTURE) ×1 IMPLANT
SUT VIC AB 3-0 SH 18 (SUTURE) ×1 IMPLANT
SUT VIC AB 3-0 SH 27 (SUTURE) ×1
SUT VIC AB 3-0 SH 27X BRD (SUTURE) ×1 IMPLANT
SYR BULB IRRIG 60ML STRL (SYRINGE) ×1 IMPLANT
SYR CONTROL 10ML LL (SYRINGE) ×1 IMPLANT
TOWEL OR 17X26 10 PK STRL BLUE (TOWEL DISPOSABLE) ×1 IMPLANT
TUBE CONNECTING 12X1/4 (SUCTIONS) ×1 IMPLANT
YANKAUER SUCT BULB TIP NO VENT (SUCTIONS) ×1 IMPLANT

## 2022-02-02 NOTE — Op Note (Signed)
   Patient: Adam Noble (Apr 06, 1955, [id number])  Date of Surgery: 02/02/2022   Preoperative Diagnosis: LEFT UPPER BACK LIPOMA   Postoperative Diagnosis: LEFT UPPER BACK LIPOMA   Surgical Procedure: EXCISION OF LIPOMA LEFT UPPER BACK:    Operative Team Members:  Surgeon(s) and Role:    * Telford Archambeau, Nickola Major, MD - Primary   Anesthesiologist: Nilda Simmer, MD CRNA: Georgeanne Nim, CRNA   Anesthesia: Monitor Anesthesia Care   Fluids:  Total I/O In: 500 [V.F.:643] Out: -   Complications: * No complications entered in OR log *  Drains:  none   Specimen:  ID Type Source Tests Collected by Time Destination  1 : left upper back lipoma Tissue PATH Other SURGICAL PATHOLOGY Mack Thurmon, Nickola Major, MD 02/02/2022 1505      Disposition:  PACU - hemodynamically stable.  Plan of Care: Discharge to home after PACU    Indications for Procedure: Adam Noble is a 66 y.o. male who presented with a symptomatic  lipoma.  I recommended excision.  The procedure itself as well as its risks, benefits and alternatives were discussed.  The risks discussed included but were not limited to the risk of infection, bleeding, damage to nearby structures, and recurrent lipoma.  After a full discussion and all questions answered the patient granted consent to proceed.  Findings: Lipoma 3.5 cm x 3 cm x 2 cm   Description of Procedure:  On the date stated above patient taken the operating room suite and placed in right lateral position.  Anesthesia was induced.  Timeout was completed.  Antibiotics were given.  Anesthetize the skin around the mass in the left upper back.  I made an elliptical incision incorporating the biopsy scar into the specimen.  I dissected through the skin and circumferentially around the lipoma using sharp dissection blunt dissection and electrocautery.  The lipoma was well encapsulated.  It was removed that from the field, measured and submitted as a specimen.   It measured 3.5 cm x 3 cm x 2 cm.  Hemostasis was obtained with electrocautery.  The wound was irrigated.  The remainder of the local was injected.  The subcutaneous layers and deep dermal layer were reapproximated using 3-0 Vicryl suture.  The skin was closed with 4-0 Monocryl and Dermabond.  All sponge needle counts were correct at the end of this case.  At the end of the case we reviewed the infection status of the case. Patient: Private Patient Elective Case Case: Elective Infection Present At Time Of Surgery (PATOS): None  Louanna Raw, MD General, Bariatric, & Minimally Invasive Surgery [facility], [place]

## 2022-02-02 NOTE — Transfer of Care (Signed)
Immediate Anesthesia Transfer of Care Note  Patient: [name]  Procedure(s) Performed: EXCISION OF LIPOMA LEFT UPPER BACK (Left: Back)  Patient Location: PACU  Anesthesia Type:MAC  Level of Consciousness: awake and patient cooperative  Airway & Oxygen Therapy: Patient Spontanous Breathing and Patient connected to face mask oxygen  Post-op Assessment: Report given to RN and Post -op Vital signs reviewed and stable  Post vital signs: Reviewed and stable  Last Vitals:  Vitals Value Taken Time  BP 110/74 02/02/22 1530  Temp 36.4 C 02/02/22 1530  Pulse 64 02/02/22 1534  Resp 16 02/02/22 1534  SpO2 91 % 02/02/22 1534  Vitals shown include unvalidated device data.  Last Pain:  Vitals:   02/02/22 1356  TempSrc: Oral  PainSc: 0-No pain      Patients Stated Pain Goal: 4 (52/84/13 2440)  Complications: No notable events documented.

## 2022-02-02 NOTE — Anesthesia Preprocedure Evaluation (Addendum)
Anesthesia Evaluation  Patient identified by MRN, date of birth, ID band Patient awake    Reviewed: Allergy & Precautions, NPO status , Patient's Chart, lab work & pertinent test results  History of Anesthesia Complications Negative for: history of anesthetic complications  Airway Mallampati: II  TM Distance: >3 FB Neck ROM: Full    Dental  (+) Teeth Intact, Dental Advisory Given   Pulmonary neg shortness of breath, neg sleep apnea, neg COPD, Recent URI , Residual Cough   breath sounds clear to auscultation       Cardiovascular hypertension, Pt. on medications (-) angina (-) Past MI and (-) CHF  Rhythm:Regular     Neuro/Psych negative neurological ROS  negative psych ROS   GI/Hepatic negative GI ROS, Neg liver ROS,,,  Endo/Other  negative endocrine ROS    Renal/GU Renal diseaseS/p partial right nephrectomy  Lab Results      Component                Value               Date                      CREATININE               1.10                02/02/2022                Musculoskeletal negative musculoskeletal ROS (+)    Abdominal   Peds  Hematology negative hematology ROS (+)   Anesthesia Other Findings   Reproductive/Obstetrics                             Anesthesia Physical Anesthesia Plan  ASA: 2  Anesthesia Plan: MAC   Post-op Pain Management: Gabapentin PO (pre-op)* and Tylenol PO (pre-op)*   Induction: Intravenous  PONV Risk Score and Plan: 1 and Propofol infusion and Treatment may vary due to age or medical condition  Airway Management Planned: Nasal Cannula and Natural Airway  Additional Equipment: None  Intra-op Plan:   Post-operative Plan:   Informed Consent: I have reviewed the patients History and Physical, chart, labs and discussed the procedure including the risks, benefits and alternatives for the proposed anesthesia with the patient or authorized representative  who has indicated his/her understanding and acceptance.     Dental advisory given  Plan Discussed with: CRNA  Anesthesia Plan Comments:        Anesthesia Quick Evaluation

## 2022-02-02 NOTE — Discharge Instructions (Signed)
     No acetaminophen/Tylenol until after 8:00 pm today if needed.     Post Anesthesia Home Care Instructions  Activity: Get plenty of rest for the remainder of the day. A responsible individual must stay with you for 24 hours following the procedure.  For the next 24 hours, DO NOT: -Drive a car -Paediatric nurse -Drink alcoholic beverages -Take any medication unless instructed by your physician -Make any legal decisions or sign important papers.  Meals: Start with liquid foods such as gelatin or soup. Progress to regular foods as tolerated. Avoid greasy, spicy, heavy foods. If nausea and/or vomiting occur, drink only clear liquids until the nausea and/or vomiting subsides. Call your physician if vomiting continues.  Special Instructions/Symptoms: Your throat may feel dry or sore from the anesthesia or the breathing tube placed in your throat during surgery. If this causes discomfort, gargle with warm salt water. The discomfort should disappear within 24 hours.

## 2022-02-02 NOTE — Anesthesia Postprocedure Evaluation (Signed)
Anesthesia Post Note  Patient: Adam Noble  Procedure(s) Performed: EXCISION OF LIPOMA LEFT UPPER BACK (Left: Back)     Patient location during evaluation: PACU Anesthesia Type: MAC Level of consciousness: awake Pain management: pain level controlled Vital Signs Assessment: post-procedure vital signs reviewed and stable Respiratory status: spontaneous breathing, nonlabored ventilation and respiratory function stable Cardiovascular status: stable and blood pressure returned to baseline Postop Assessment: no apparent nausea or vomiting Anesthetic complications: no   No notable events documented.  Last Vitals:  Vitals:   02/02/22 1530 02/02/22 1545  BP: 110/74 126/74  Pulse: 69 62  Resp: 16 17  Temp: 36.4 C   SpO2: 96% 100%    Last Pain:  Vitals:   02/02/22 1545  TempSrc:   PainSc: 0-No pain                 Nilda Simmer

## 2022-02-02 NOTE — H&P (Signed)
Admitting Physician: Nickola Major Cosmo Tetreault  Service: General Surgery  CC: Lipoma  Subjective   HPI: Adam Noble is an 66 y.o. male who is here for lipoma removal  Past Medical History:  Diagnosis Date   BPH (benign prostatic hypertrophy)    Cancer (HCC)    Contact lens/glasses fitting    wears contacts or glasses   Hyperlipemia    Hypertension     Past Surgical History:  Procedure Laterality Date   ACHILLES TENDON SURGERY Right 11/03/2012   Procedure: RIGHT ACHILLES TENDON DEBRIDEMENT/RECONSTRUCTION ;  Surgeon: Wylene Simmer, MD;  Location: [facility];  Service: Orthopedics;  Laterality: Right;   COLONOSCOPY     GASTROCNEMIUS RECESSION Right 11/03/2012   Procedure: GASTROC RECESSION;  Surgeon: Wylene Simmer, MD;  Location: [facility];  Service: Orthopedics;  Laterality: Right;   ORIF ANKLE FRACTURE  2009   left   ROBOTIC ASSITED PARTIAL NEPHRECTOMY Right 09/19/2020   Procedure: XI ROBOTIC ASSITED RIGHT PARTIAL NEPHRECTOMY;  Surgeon: [name], MD;  Location: [facility];  Service: Urology;  Laterality: Right;   TENDON TRANSFER Right 11/03/2012   Procedure: FLEXOR HALLUCIS LONGUS TRANSFER TO CALCANEUS ;  Surgeon: Wylene Simmer, MD;  Location: [facility];  Service: Orthopedics;  Laterality: Right;   TONSILLECTOMY      History reviewed. No pertinent family history.  Social:  reports that he has never smoked. He has never used smokeless tobacco. He reports current alcohol use. He reports that he does not use drugs.  Allergies: No Known Allergies  Medications: Current Outpatient Medications  Medication Instructions   atorvastatin (LIPITOR) 20 mg, Oral, Daily   azelastine (ASTELIN) 0.1 % nasal spray 1 spray, Each Nare, 2 times daily, Use in each nostril as directed   docusate sodium (COLACE) 100 mg, Oral, 2 times daily   finasteride (PROSCAR) 5 mg, Daily   fluticasone (FLONASE) 50 MCG/ACT nasal spray USE 1 TO 2 SPRAYS IN EACH  NOSTRIL ONCE DAILY AS DIRECTED   hydrochlorothiazide (HYDRODIURIL) 25 mg, Daily   oxymetazoline (AFRIN) 0.05 % nasal spray 1 spray, Each Nare, At bedtime PRN   traMADol (ULTRAM) 50-100 mg, Oral, Every 6 hours PRN    ROS - all of the below systems have been reviewed with the patient and positives are indicated with bold text General: chills, fever or night sweats Eyes: blurry vision or double vision ENT: epistaxis or sore throat Allergy/Immunology: itchy/watery eyes or nasal congestion Hematologic/Lymphatic: bleeding problems, blood clots or swollen lymph nodes Endocrine: temperature intolerance or unexpected weight changes Breast: new or changing breast lumps or nipple discharge Resp: cough, shortness of breath, or wheezing CV: chest pain or dyspnea on exertion GI: as per HPI GU: dysuria, trouble voiding, or hematuria MSK: joint pain or joint stiffness Neuro: TIA or stroke symptoms Derm: pruritus and skin lesion changes Psych: anxiety and depression  Objective   PE Height '5\' 7"'$  (1.702 m), weight 78.9 kg. Constitutional: NAD; conversant; no deformities Eyes: Moist conjunctiva; no lid lag; anicteric; PERRL Neck: Trachea midline; no thyromegaly Lungs: Normal respiratory effort; no tactile fremitus CV: RRR; no palpable thrills; no pitting edema GI: Abd soft, nontender; no palpable hepatosplenomegaly MSK:  Normal range of motion of extremities; no clubbing/cyanosis Psychiatric: Appropriate affect; alert and oriented x3 Lymphatic: No palpable cervical or axillary lymphadenopathy Back-5 cm x 5 cm left upper back subcutaneous mass with overlying biopsy scar. Mass is rubbery, mobile, not fixed underlying tissues.   No results found for this  or any previous visit (from the past 24 hour(s)).  Imaging Orders  No imaging studies ordered today     Assessment and Plan   Adam Noble has a biopsy-proven left upper back lipoma. I recommended excision. The procedure itself as well as its  risk, benefits, and alternatives were discussed with patient in full who granted consent to proceed. Our surgery scheduler will reach out to the patient to schedule surgery.    Felicie Morn, MD  [facility] Use [email or web address] to contact on call provider

## 2022-02-03 ENCOUNTER — Encounter (HOSPITAL_BASED_OUTPATIENT_CLINIC_OR_DEPARTMENT_OTHER): Payer: Self-pay | Admitting: Surgery

## 2022-02-04 LAB — SURGICAL PATHOLOGY

## 2022-02-05 DIAGNOSIS — J301 Allergic rhinitis due to pollen: Secondary | ICD-10-CM | POA: Diagnosis not present

## 2022-02-05 DIAGNOSIS — J3089 Other allergic rhinitis: Secondary | ICD-10-CM | POA: Diagnosis not present

## 2022-02-05 DIAGNOSIS — J3081 Allergic rhinitis due to animal (cat) (dog) hair and dander: Secondary | ICD-10-CM | POA: Diagnosis not present

## 2022-02-24 DIAGNOSIS — J343 Hypertrophy of nasal turbinates: Secondary | ICD-10-CM | POA: Diagnosis not present

## 2022-02-24 DIAGNOSIS — J309 Allergic rhinitis, unspecified: Secondary | ICD-10-CM | POA: Diagnosis not present

## 2022-02-24 DIAGNOSIS — C649 Malignant neoplasm of unspecified kidney, except renal pelvis: Secondary | ICD-10-CM | POA: Diagnosis not present

## 2022-03-02 ENCOUNTER — Other Ambulatory Visit: Payer: Self-pay | Admitting: Urology

## 2022-03-02 ENCOUNTER — Ambulatory Visit
Admission: RE | Admit: 2022-03-02 | Discharge: 2022-03-02 | Disposition: A | Discharge status: Home / Self Care | Payer: BC Managed Care – PPO | Source: Ambulatory Visit | Attending: Urology | Admitting: Urology

## 2022-03-02 DIAGNOSIS — C649 Malignant neoplasm of unspecified kidney, except renal pelvis: Secondary | ICD-10-CM | POA: Diagnosis not present

## 2022-03-02 DIAGNOSIS — K7689 Other specified diseases of liver: Secondary | ICD-10-CM | POA: Diagnosis not present

## 2022-03-02 DIAGNOSIS — K760 Fatty (change of) liver, not elsewhere classified: Secondary | ICD-10-CM | POA: Diagnosis not present

## 2022-03-02 DIAGNOSIS — D49511 Neoplasm of unspecified behavior of right kidney: Secondary | ICD-10-CM

## 2022-03-02 DIAGNOSIS — J3489 Other specified disorders of nose and nasal sinuses: Secondary | ICD-10-CM | POA: Diagnosis not present

## 2022-03-02 MED ORDER — GADOPICLENOL 0.5 MMOL/ML IV SOLN
7.5000 mL | Freq: Once | INTRAVENOUS | Status: AC | PRN
  Administered 2022-03-02: 7.5 mL via INTRAVENOUS

## 2022-03-03 DIAGNOSIS — J3081 Allergic rhinitis due to animal (cat) (dog) hair and dander: Secondary | ICD-10-CM | POA: Diagnosis not present

## 2022-03-03 DIAGNOSIS — J301 Allergic rhinitis due to pollen: Secondary | ICD-10-CM | POA: Diagnosis not present

## 2022-03-03 DIAGNOSIS — J3089 Other allergic rhinitis: Secondary | ICD-10-CM | POA: Diagnosis not present

## 2022-03-09 DIAGNOSIS — R3912 Poor urinary stream: Secondary | ICD-10-CM | POA: Diagnosis not present

## 2022-03-09 DIAGNOSIS — R972 Elevated prostate specific antigen [PSA]: Secondary | ICD-10-CM | POA: Diagnosis not present

## 2022-03-09 DIAGNOSIS — D49511 Neoplasm of unspecified behavior of right kidney: Secondary | ICD-10-CM | POA: Diagnosis not present

## 2022-03-19 DIAGNOSIS — J309 Allergic rhinitis, unspecified: Secondary | ICD-10-CM | POA: Diagnosis not present

## 2022-03-19 DIAGNOSIS — C649 Malignant neoplasm of unspecified kidney, except renal pelvis: Secondary | ICD-10-CM | POA: Diagnosis not present

## 2022-03-19 DIAGNOSIS — J343 Hypertrophy of nasal turbinates: Secondary | ICD-10-CM | POA: Diagnosis not present

## 2022-04-10 DIAGNOSIS — J302 Other seasonal allergic rhinitis: Secondary | ICD-10-CM | POA: Diagnosis not present

## 2022-04-10 DIAGNOSIS — Z1152 Encounter for screening for COVID-19: Secondary | ICD-10-CM | POA: Diagnosis not present

## 2022-04-10 DIAGNOSIS — I10 Essential (primary) hypertension: Secondary | ICD-10-CM | POA: Diagnosis not present

## 2022-04-10 DIAGNOSIS — H6691 Otitis media, unspecified, right ear: Secondary | ICD-10-CM | POA: Diagnosis not present

## 2022-04-10 DIAGNOSIS — R0981 Nasal congestion: Secondary | ICD-10-CM | POA: Diagnosis not present

## 2022-04-10 DIAGNOSIS — J029 Acute pharyngitis, unspecified: Secondary | ICD-10-CM | POA: Diagnosis not present

## 2022-04-10 DIAGNOSIS — R051 Acute cough: Secondary | ICD-10-CM | POA: Diagnosis not present

## 2022-08-18 DIAGNOSIS — R310 Gross hematuria: Secondary | ICD-10-CM | POA: Diagnosis not present

## 2022-09-04 DIAGNOSIS — N281 Cyst of kidney, acquired: Secondary | ICD-10-CM | POA: Diagnosis not present

## 2022-09-04 DIAGNOSIS — N210 Calculus in bladder: Secondary | ICD-10-CM | POA: Diagnosis not present

## 2022-09-04 DIAGNOSIS — R310 Gross hematuria: Secondary | ICD-10-CM | POA: Diagnosis not present

## 2022-12-31 ENCOUNTER — Other Ambulatory Visit: Payer: Self-pay | Admitting: Urology

## 2022-12-31 DIAGNOSIS — D49511 Neoplasm of unspecified behavior of right kidney: Secondary | ICD-10-CM

## 2023-03-01 ENCOUNTER — Ambulatory Visit
Admission: RE | Admit: 2023-03-01 | Discharge: 2023-03-01 | Disposition: A | Discharge status: Home / Self Care | Payer: Medicare Other | Source: Ambulatory Visit | Attending: Urology | Admitting: Urology

## 2023-03-01 DIAGNOSIS — D49511 Neoplasm of unspecified behavior of right kidney: Secondary | ICD-10-CM

## 2023-03-01 MED ORDER — GADOPICLENOL 0.5 MMOL/ML IV SOLN
9.0000 mL | Freq: Once | INTRAVENOUS | Status: AC | PRN
  Administered 2023-03-01: 9 mL via INTRAVENOUS

## 2023-11-02 ENCOUNTER — Encounter: Payer: Self-pay | Admitting: Urology

## 2023-11-04 ENCOUNTER — Other Ambulatory Visit: Payer: Self-pay | Admitting: Urology

## 2023-11-04 DIAGNOSIS — N401 Enlarged prostate with lower urinary tract symptoms: Secondary | ICD-10-CM

## 2023-11-19 ENCOUNTER — Other Ambulatory Visit

## 2023-11-24 NOTE — Progress Notes (Signed)
 Chief Complaint: Patient was seen in consultation today for benign prostatic hyperplasia with lower urinary tract symptoms.   Referring Physician(s): Herrick,Benjamin W  History of Present Illness: Adam Noble is a 68 y.o. male with a medical history significant for right renal cell carcinoma (2022; partial nephrectomy), bladder stone and BPH with an elevated PSA which has remained stable over the years. He is followed by Urology for intermittent gross hematuria. The patient also struggles with urgency, frequency and difficulty emptying his bladder - all symptoms that have progressively worsened. These symptoms started nearly a decade ago.  His main complaints are incomplete emptying, straining to void, and weak stream.  He denies any prior episodes of acute urinary retention.  He has taken Finasteride for many years. At his last visit with his Urologist the patient expressed an interest in pursuing treatment for his BPH/LUTS. Dr. Cam discussed multiple options including TURP, Aqua ablation and prostate artery embolization. After reviewing procedure details, risks and benefits the patient is most interested in PAE.       Past Medical History:  Diagnosis Date   BPH (benign prostatic hypertrophy)    Cancer (HCC)    Contact lens/glasses fitting    wears contacts or glasses   Hyperlipemia    Hypertension     Past Surgical History:  Procedure Laterality Date   ACHILLES TENDON SURGERY Right 11/03/2012   Procedure: RIGHT ACHILLES TENDON DEBRIDEMENT/RECONSTRUCTION ;  Surgeon: Norleen Armor, MD;  Location: [facility];  Service: Orthopedics;  Laterality: Right;   COLONOSCOPY     GASTROCNEMIUS RECESSION Right 11/03/2012   Procedure: GASTROC RECESSION;  Surgeon: Norleen Armor, MD;  Location: [facility];  Service: Orthopedics;  Laterality: Right;   LIPOMA EXCISION Left 02/02/2022   Procedure: EXCISION OF LIPOMA LEFT UPPER BACK;  Surgeon: Lyndel Deward PARAS,  MD;  Location: [facility];  Service: General;  Laterality: Left;   ORIF ANKLE FRACTURE  2009   left   ROBOTIC ASSITED PARTIAL NEPHRECTOMY Right 09/19/2020   Procedure: XI ROBOTIC ASSITED RIGHT PARTIAL NEPHRECTOMY;  Surgeon: Cam Morene ORN, MD;  Location: [facility];  Service: Urology;  Laterality: Right;   TENDON TRANSFER Right 11/03/2012   Procedure: FLEXOR HALLUCIS LONGUS TRANSFER TO CALCANEUS ;  Surgeon: Norleen Armor, MD;  Location: [facility];  Service: Orthopedics;  Laterality: Right;   TONSILLECTOMY      Allergies: Patient has no known allergies.  Medications: Prior to Admission medications   Medication Sig Start Date End Date Taking? Authorizing Provider  aspirin  EC 81 MG tablet Take 81 mg by mouth daily. Swallow whole.    [provider]  atorvastatin  (LIPITOR) 20 MG tablet Take 20 mg by mouth daily.    [provider]  azelastine  (ASTELIN ) 0.1 % nasal spray Place 1 spray into both nostrils 2 (two) times daily. Use in each nostril as directed    [provider]  finasteride (PROSCAR) 5 MG tablet Take 5 mg by mouth daily.    [provider]  fluticasone  (FLONASE ) 50 MCG/ACT nasal spray USE 1 TO 2 SPRAYS IN EACH NOSTRIL ONCE DAILY AS DIRECTED Patient taking differently: Place 2 sprays into both nostrils daily. 02/08/15   Kozlow, Camellia PARAS, MD  glucosamine-chondroitin 500-400 MG tablet Take 1 tablet by mouth daily.    [provider]  hydrochlorothiazide (HYDRODIURIL) 25 MG tablet Take 25 mg by mouth daily.    [provider]  oxymetazoline  (AFRIN) 0.05 % nasal spray Place  1 spray into both nostrils at bedtime as needed for congestion.    [provider]     No family history on file.  Social History   Socioeconomic History   Marital status: Married    Spouse name: Not on file   Number of children: Not on file   Years of education: Not on file   Highest education level: Not on file   Occupational History   Not on file  Tobacco Use   Smoking status: Never   Smokeless tobacco: Never  Vaping Use   Vaping status: Never Used  Substance and Sexual Activity   Alcohol  use: Yes    Comment: occ   Drug use: No   Sexual activity: Not on file  Other Topics Concern   Not on file  Social History Narrative   Not on file   Social Drivers of Health   Financial Resource Strain: Not on file  Food Insecurity: Not on file  Transportation Needs: Not on file  Physical Activity: Not on file  Stress: Not on file  Social Connections: Unknown (08/01/2021)   Received from [facility]   Social Network    Social Network: Not on file    Review of Systems: A 12 point ROS discussed and pertinent positives are indicated in the HPI above.  All other systems are negative.  Review of Systems  Vital Signs: There were no vitals taken for this visit.  Advance Care Plan: The advanced care plan/surrogate decision maker was discussed at the time of visit and documented in the medical record.    Physical Exam  Imaging: CT Pelvis 03/26/23  ~76 g  Labs:  CBC: No results for input(s): WBC, HGB, HCT, PLT in the last 8760 hours.  COAGS: No results for input(s): INR, APTT in the last 8760 hours.  BMP: No results for input(s): NA, K, CL, CO2, GLUCOSE, BUN, CALCIUM , CREATININE, GFRNONAA, GFRAA in the last 8760 hours.  Invalid input(s): CMP  LIVER FUNCTION TESTS: No results for input(s): BILITOT, AST, ALT, ALKPHOS, PROT, ALBUMIN in the last 8760 hours.  TUMOR MARKERS: No results for input(s): AFPTM, CEA, CA199, CHROMGRNA in the last 8760 hours.  Assessment and Plan: 68 year old male with a history of benign prostatic hyperplasia (76 g) with progressively worsening lower urinary tract symptoms (IPSS-QoL 24-4). He would be an excellent candidate for prostate artery embolization.  We discussed the rationale, periprocedural  expectations, and long term expected outcomes including risks and benefits.  He would like to proceed.  -obtain CTA pelvis  -plan for prostate artery embolization via left radial artery approach at [facility] with moderate sedation.    Ester Sides, MD Pager: [phone]   I spent a total of  30 Minutes   in face to face in clinical consultation, greater than 50% of which was counseling/coordinating care for benign prostatic hyperplasia.

## 2023-11-26 ENCOUNTER — Ambulatory Visit
Admission: RE | Admit: 2023-11-26 | Discharge: 2023-11-26 | Disposition: A | Discharge status: Home / Self Care | Source: Ambulatory Visit | Attending: Urology | Admitting: Urology

## 2023-11-26 ENCOUNTER — Other Ambulatory Visit: Payer: Self-pay | Admitting: Interventional Radiology

## 2023-11-26 ENCOUNTER — Other Ambulatory Visit: Payer: Self-pay

## 2023-11-26 DIAGNOSIS — N401 Enlarged prostate with lower urinary tract symptoms: Secondary | ICD-10-CM

## 2023-11-26 HISTORY — PX: IR RADIOLOGIST EVAL & MGMT: IMG5224

## 2023-12-01 ENCOUNTER — Ambulatory Visit
Admission: RE | Admit: 2023-12-01 | Discharge: 2023-12-01 | Disposition: A | Discharge status: Home / Self Care | Source: Ambulatory Visit | Attending: Interventional Radiology | Admitting: Interventional Radiology

## 2023-12-01 DIAGNOSIS — N401 Enlarged prostate with lower urinary tract symptoms: Secondary | ICD-10-CM

## 2023-12-01 MED ORDER — IOPAMIDOL (ISOVUE-370) INJECTION 76%
100.0000 mL | Freq: Once | INTRAVENOUS | Status: AC | PRN
  Administered 2023-12-01: 100 mL via INTRAVENOUS

## 2023-12-16 ENCOUNTER — Other Ambulatory Visit (HOSPITAL_COMMUNITY): Payer: Self-pay | Admitting: Interventional Radiology

## 2023-12-16 ENCOUNTER — Telehealth (HOSPITAL_COMMUNITY): Payer: Self-pay

## 2023-12-16 DIAGNOSIS — N401 Enlarged prostate with lower urinary tract symptoms: Secondary | ICD-10-CM

## 2023-12-16 NOTE — Telephone Encounter (Signed)
 Called to schedule procedure, no answer, left vm. AB

## 2024-01-17 ENCOUNTER — Other Ambulatory Visit (HOSPITAL_COMMUNITY): Payer: Self-pay | Admitting: Student

## 2024-01-17 ENCOUNTER — Telehealth (HOSPITAL_COMMUNITY): Payer: Self-pay | Admitting: Student

## 2024-01-17 DIAGNOSIS — N401 Enlarged prostate with lower urinary tract symptoms: Secondary | ICD-10-CM

## 2024-01-17 MED ORDER — CIPROFLOXACIN HCL 500 MG PO TABS: 500.0000 mg | ORAL_TABLET | Freq: Two times a day (BID) | ORAL | 0 refills | Status: AC

## 2024-01-17 MED ORDER — METHYLPREDNISOLONE 4 MG PO TBPK: ORAL_TABLET | ORAL | 0 refills | Status: AC

## 2024-01-17 MED ORDER — SOLIFENACIN SUCCINATE 5 MG PO TABS: 5.0000 mg | ORAL_TABLET | Freq: Every day | ORAL | 0 refills | Status: AC

## 2024-01-17 MED ORDER — PHENAZOPYRIDINE HCL 100 MG PO TABS: 100.0000 mg | ORAL_TABLET | Freq: Three times a day (TID) | ORAL | 0 refills | Status: AC

## 2024-01-17 NOTE — Telephone Encounter (Signed)
 Patient scheduled for prostate artery embolization 01/20/24 with Dr. Jennefer. Post-procedure medications have been e-prescribed to the [facility] at [place]. Patient aware to be at [facility] at 7 am, to be NPO after midnight and to have a driver. He can take any necessary medications with small sips of water . Patient has requested the foley catheter be placed after he has been sedated for the procedure. This request will be passed onto the team at [facility]. Patient knows to call the clinic with any questions prior to his procedure day.   Warren Dais, AGACNP-BC 01/17/2024, 9:34 AM

## 2024-01-20 ENCOUNTER — Other Ambulatory Visit (HOSPITAL_COMMUNITY): Payer: Self-pay | Admitting: Interventional Radiology

## 2024-01-20 ENCOUNTER — Ambulatory Visit (HOSPITAL_COMMUNITY)
Admission: RE | Admit: 2024-01-20 | Discharge: 2024-01-20 | Disposition: A | Discharge status: Home / Self Care | Source: Ambulatory Visit | Attending: Interventional Radiology | Admitting: Interventional Radiology

## 2024-01-20 DIAGNOSIS — N401 Enlarged prostate with lower urinary tract symptoms: Secondary | ICD-10-CM

## 2024-01-20 DIAGNOSIS — R3916 Straining to void: Secondary | ICD-10-CM | POA: Diagnosis not present

## 2024-01-20 DIAGNOSIS — R3912 Poor urinary stream: Secondary | ICD-10-CM | POA: Diagnosis not present

## 2024-01-20 HISTORY — PX: IR US GUIDE VASC ACCESS LEFT: IMG2389

## 2024-01-20 HISTORY — PX: IR EMBO ARTERIAL NOT HEMORR HEMANG INC GUIDE ROADMAPPING: IMG5448

## 2024-01-20 HISTORY — PX: IR US GUIDE VASC ACCESS RIGHT: IMG2390

## 2024-01-20 HISTORY — PX: IR ANGIOGRAM VISCERAL SELECTIVE: IMG657

## 2024-01-20 HISTORY — PX: IR ANGIOGRAM SELECTIVE EACH ADDITIONAL VESSEL: IMG667

## 2024-01-20 LAB — CBC
HCT: 45.9 % (ref 39.0–52.0)
Hemoglobin: 15.5 g/dL (ref 13.0–17.0)
MCH: 29.2 pg (ref 26.0–34.0)
MCHC: 33.8 g/dL (ref 30.0–36.0)
MCV: 86.6 fL (ref 80.0–100.0)
Platelets: 228 K/uL (ref 150–400)
RBC: 5.3 MIL/uL (ref 4.22–5.81)
RDW: 12.7 % (ref 11.5–15.5)
WBC: 6 K/uL (ref 4.0–10.5)
nRBC: 0 % (ref 0.0–0.2)

## 2024-01-20 LAB — BASIC METABOLIC PANEL WITH GFR
Anion gap: 13 (ref 5–15)
BUN: 15 mg/dL (ref 8–23)
CO2: 25 mmol/L (ref 22–32)
Calcium: 9.3 mg/dL (ref 8.9–10.3)
Chloride: 103 mmol/L (ref 98–111)
Creatinine, Ser: 1.22 mg/dL (ref 0.61–1.24)
GFR, Estimated: >60 mL/min (ref >60)
Glucose, Bld: 89 mg/dL (ref 70–99)
Potassium: 3.9 mmol/L (ref 3.5–5.1)
Sodium: 141 mmol/L (ref 135–145)

## 2024-01-20 LAB — PROTIME-INR
INR: 1 (ref 0.8–1.2)
Prothrombin Time: 13.7 s (ref 11.4–15.2)

## 2024-01-20 MED ORDER — PREDNISONE 20 MG PO TABS
20.0000 mg | ORAL_TABLET | Freq: Once | ORAL | Status: AC
  Administered 2024-01-20: 20 mg via ORAL
  Filled 2024-01-20: qty 1

## 2024-01-20 MED ORDER — NITROGLYCERIN 2 % TD OINT
1.0000 [in_us] | TOPICAL_OINTMENT | Freq: Once | TRANSDERMAL | Status: AC
  Administered 2024-01-20: 1 [in_us] via TOPICAL
  Filled 2024-01-20: qty 1

## 2024-01-20 MED ORDER — NITROGLYCERIN 1 MG/10 ML FOR IR/CATH LAB
INTRA_ARTERIAL | Status: AC
  Filled 2024-01-20: qty 10

## 2024-01-20 MED ORDER — NITROGLYCERIN 1 MG/10 ML FOR IR/CATH LAB
INTRA_ARTERIAL | Status: AC | PRN
  Administered 2024-01-20 (×2): 200 ug via INTRA_ARTERIAL

## 2024-01-20 MED ORDER — HEPARIN SODIUM (PORCINE) 1000 UNIT/ML IJ SOLN
INTRAMUSCULAR | Status: AC
  Filled 2024-01-20: qty 10

## 2024-01-20 MED ORDER — FENTANYL CITRATE (PF) 100 MCG/2ML IJ SOLN
INTRAMUSCULAR | Status: AC
  Filled 2024-01-20: qty 4

## 2024-01-20 MED ORDER — IODIXANOL 320 MG/ML IV SOLN
100.0000 mL | Freq: Once | INTRAVENOUS | Status: AC | PRN
  Administered 2024-01-20: 5 mL via INTRA_ARTERIAL

## 2024-01-20 MED ORDER — ONDANSETRON HCL 4 MG/2ML IJ SOLN
INTRAMUSCULAR | Status: AC
  Filled 2024-01-20: qty 2

## 2024-01-20 MED ORDER — IODIXANOL 320 MG/ML IV SOLN
100.0000 mL | Freq: Once | INTRAVENOUS | Status: AC | PRN
  Administered 2024-01-20: 80 mL via INTRA_ARTERIAL

## 2024-01-20 MED ORDER — MIDAZOLAM HCL (PF) 2 MG/2ML IJ SOLN
INTRAMUSCULAR | Status: AC | PRN
  Administered 2024-01-20: .5 mg via INTRAVENOUS
  Administered 2024-01-20: 1 mg via INTRAVENOUS
  Administered 2024-01-20: .5 mg via INTRAVENOUS

## 2024-01-20 MED ORDER — LIDOCAINE HCL URETHRAL/MUCOSAL 2 % EX GEL
1.0000 | Freq: Once | CUTANEOUS | Status: AC
  Administered 2024-01-20: 1 via URETHRAL
  Filled 2024-01-20: qty 6

## 2024-01-20 MED ORDER — MIDAZOLAM HCL 2 MG/2ML IJ SOLN
INTRAMUSCULAR | Status: AC
  Filled 2024-01-20: qty 4

## 2024-01-20 MED ORDER — CIPROFLOXACIN IN D5W 400 MG/200ML IV SOLN
INTRAVENOUS | Status: AC | PRN
  Administered 2024-01-20: 400 mg via INTRAVENOUS

## 2024-01-20 MED ORDER — CIPROFLOXACIN IN D5W 400 MG/200ML IV SOLN: 400.0000 mg | INTRAVENOUS | Status: DC

## 2024-01-20 MED ORDER — FENTANYL CITRATE (PF) 100 MCG/2ML IJ SOLN
INTRAMUSCULAR | Status: AC | PRN
  Administered 2024-01-20: 25 ug via INTRAVENOUS
  Administered 2024-01-20: 50 ug via INTRAVENOUS
  Administered 2024-01-20: 25 ug via INTRAVENOUS

## 2024-01-20 MED ORDER — LIDOCAINE-EPINEPHRINE 1 %-1:100000 IJ SOLN
INTRAMUSCULAR | Status: AC
  Filled 2024-01-20: qty 1

## 2024-01-20 MED ORDER — SODIUM CHLORIDE 0.9 % IV SOLN: INTRAVENOUS | Status: DC

## 2024-01-20 MED ORDER — LIDOCAINE-EPINEPHRINE 1 %-1:100000 IJ SOLN
20.0000 mL | Freq: Once | INTRAMUSCULAR | Status: AC
  Administered 2024-01-20: 20 mL via INTRADERMAL

## 2024-01-20 MED ORDER — VERAPAMIL HCL 2.5 MG/ML IV SOLN
INTRAVENOUS | Status: AC
  Filled 2024-01-20: qty 2

## 2024-01-20 MED ORDER — VERAPAMIL HCL 2.5 MG/ML IV SOLN: INTRA_ARTERIAL | Status: AC | PRN

## 2024-01-20 MED ORDER — CIPROFLOXACIN IN D5W 400 MG/200ML IV SOLN
INTRAVENOUS | Status: AC
  Filled 2024-01-20: qty 200

## 2024-01-20 MED ORDER — LIDOCAINE-PRILOCAINE 2.5-2.5 % EX CREA
TOPICAL_CREAM | Freq: Once | CUTANEOUS | Status: AC
  Filled 2024-01-20: qty 5

## 2024-01-20 MED ORDER — CHLORHEXIDINE GLUCONATE CLOTH 2 % EX PADS: 6.0000 | MEDICATED_PAD | Freq: Every day | CUTANEOUS | Status: DC

## 2024-01-20 NOTE — Sedation Documentation (Signed)
 After placing radial arterial sheath, pulse ox reading lost and pt fingers appeared white. Sheath removed and radial occlusive band placed on radial artery by MD Suttle. Color returned to fingers and pulse ox continuing to read WDL. Site being monitored by this RN.

## 2024-01-20 NOTE — H&P (Signed)
 Chief Complaint: Patient was seen in consultation today for benign prostatic hyperplasia with lower urinary tract symptoms.   Referring Physician(s): Herrick,Benjamin W   Supervising Physician: Jennefer Rover  [facility]  History of Present Illness: Adam Noble is a 69 y.o. male with a medical history significant for right renal cell carcinoma (2022; partial nephrectomy), bladder stone and BPH with an elevated PSA which has remained stable over the years. He is followed by Urology for intermittent gross hematuria. He has experienced worsening urgency, frequency and difficulty emptying his bladder. These symptoms started over a decade ago and his main complaints are incomplete emptying, straining to void, and weak stream.   He has taken Finasteride for many years. At his last visit with his Urologist the patient expressed an interest in pursuing treatment for his BPH/LUTS and Dr. Cam discussed multiple options including TURP, Aqua ablation and prostate artery embolization. The patient was most interested in prostate artery embolization and he was referred to Interventional Radiology. He met with Dr. Jennefer in consultation 11/26/23 and they discussed the risks, benefits and alternatives of prostate artery embolization. The patient was agreeable to proceed.    Past Medical History:  Diagnosis Date   BPH (benign prostatic hypertrophy)    Cancer (HCC)    Contact lens/glasses fitting    wears contacts or glasses   Hyperlipemia    Hypertension     Past Surgical History:  Procedure Laterality Date   ACHILLES TENDON SURGERY Right 11/03/2012   Procedure: RIGHT ACHILLES TENDON DEBRIDEMENT/RECONSTRUCTION ;  Surgeon: Norleen Armor, MD;  Location: [facility];  Service: Orthopedics;  Laterality: Right;   COLONOSCOPY     GASTROCNEMIUS RECESSION Right 11/03/2012   Procedure: GASTROC RECESSION;  Surgeon: Norleen Armor, MD;  Location: [facility];   Service: Orthopedics;  Laterality: Right;   IR RADIOLOGIST EVAL & MGMT  11/26/2023   LIPOMA EXCISION Left 02/02/2022   Procedure: EXCISION OF LIPOMA LEFT UPPER BACK;  Surgeon: Lyndel Deward PARAS, MD;  Location: [facility];  Service: General;  Laterality: Left;   ORIF ANKLE FRACTURE  03/24/2007   left   ROBOTIC ASSITED PARTIAL NEPHRECTOMY Right 09/19/2020   Procedure: XI ROBOTIC ASSITED RIGHT PARTIAL NEPHRECTOMY;  Surgeon: Cam Morene ORN, MD;  Location: [facility];  Service: Urology;  Laterality: Right;   TENDON TRANSFER Right 11/03/2012   Procedure: FLEXOR HALLUCIS LONGUS TRANSFER TO CALCANEUS ;  Surgeon: Norleen Armor, MD;  Location: [facility];  Service: Orthopedics;  Laterality: Right;   TONSILLECTOMY      Allergies: Patient has no known allergies.  Medications: Prior to Admission medications   Medication Sig Start Date End Date Taking? Authorizing Provider  aspirin  EC 81 MG tablet Take 81 mg by mouth daily. Swallow whole.   Yes [provider]  atorvastatin  (LIPITOR) 20 MG tablet Take 20 mg by mouth daily.   Yes [provider]  finasteride (PROSCAR) 5 MG tablet Take 5 mg by mouth daily.   Yes [provider]  glucosamine-chondroitin 500-400 MG tablet Take 1 tablet by mouth daily.   Yes [provider]  hydrochlorothiazide (HYDRODIURIL) 25 MG tablet Take 25 mg by mouth daily.   Yes [provider]  azelastine  (ASTELIN ) 0.1 % nasal spray Place 1 spray into both nostrils 2 (two) times daily. Use in each nostril as directed    [provider]  ciprofloxacin (CIPRO) 500 MG tablet Take 1 tablet (500 mg total) by mouth  2 (two) times daily for 7 days. 01/17/24 01/24/24  Kyre Jeffries R, NP  fluticasone  (FLONASE ) 50 MCG/ACT nasal spray USE 1 TO 2 SPRAYS IN EACH NOSTRIL ONCE DAILY AS DIRECTED Patient taking differently: Place 2 sprays into both nostrils daily. 02/08/15   Kozlow, Camellia PARAS, MD   methylPREDNISolone (MEDROL DOSEPAK) 4 MG TBPK tablet Dispense per instructions on the box 01/17/24   Chelisa Hennen R, NP  oxymetazoline  (AFRIN) 0.05 % nasal spray Place 1 spray into both nostrils at bedtime as needed for congestion.    [provider]  phenazopyridine (PYRIDIUM) 100 MG tablet Take 1 tablet (100 mg total) by mouth in the morning, at noon, and at bedtime for 7 days. 01/17/24 01/24/24  Danniela Mcbrearty R, NP  solifenacin (VESICARE) 5 MG tablet Take 1 tablet (5 mg total) by mouth daily for 7 days. 01/17/24 01/24/24  Tonette Warren SAUNDERS, NP     No family history on file.  Social History   Socioeconomic History   Marital status: Married    Spouse name: Not on file   Number of children: Not on file   Years of education: Not on file   Highest education level: Not on file  Occupational History   Not on file  Tobacco Use   Smoking status: Never   Smokeless tobacco: Never  Vaping Use   Vaping status: Never Used  Substance and Sexual Activity   Alcohol  use: Yes    Comment: occ   Drug use: No   Sexual activity: Not on file  Other Topics Concern   Not on file  Social History Narrative   Not on file   Social Drivers of Health   Financial Resource Strain: Not on file  Food Insecurity: Not on file  Transportation Needs: Not on file  Physical Activity: Not on file  Stress: Not on file  Social Connections: Unknown (08/01/2021)   Received from [facility]   Social Network    Social Network: Not on file    Review of Systems: A 12 point ROS discussed and pertinent positives are indicated in the HPI above.  All other systems are negative.  Review of Systems  Genitourinary:  Positive for difficulty urinating.  All other systems reviewed and are negative.   Vital Signs: BP 132/81   Pulse 67   Temp 98.1 F (36.7 C) (Oral)   Resp 17   Ht 5' 7 (1.702 m)   Wt 172 lb (78 kg)   SpO2 97%   BMI 26.94 kg/m   Physical Exam Constitutional:      General: He  is not in acute distress.    Appearance: He is not ill-appearing.  HENT:     Mouth/Throat:     Mouth: Mucous membranes are moist.     Pharynx: Oropharynx is clear.  Cardiovascular:     Rate and Rhythm: Normal rate.  Pulmonary:     Effort: Pulmonary effort is normal.  Abdominal:     Tenderness: There is no abdominal tenderness.  Musculoskeletal:     Right lower leg: No edema.     Left lower leg: No edema.  Skin:    General: Skin is warm and dry.  Neurological:     Mental Status: He is alert and oriented to person, place, and time.     Imaging: No results found.  Labs:  CBC: Recent Labs    01/20/24 0748  WBC 6.0  HGB 15.5  HCT 45.9  PLT 228    COAGS:  Recent Labs    01/20/24 0748  INR 1.0    BMP: Recent Labs    01/20/24 0748  NA 141  K 3.9  CL 103  CO2 25  GLUCOSE 89  BUN 15  CALCIUM  9.3  CREATININE 1.22  GFRNONAA >60    LIVER FUNCTION TESTS: No results for input(s): BILITOT, AST, ALT, ALKPHOS, PROT, ALBUMIN in the last 8760 hours.  TUMOR MARKERS: No results for input(s): AFPTM, CEA, CA199, CHROMGRNA in the last 8760 hours.  Assessment and Plan:  Benign prostatic hyperplasia with lower urinary tract symptoms: Adam Noble, 68 year old male, presents today to the [facility] Interventional Radiology department for an image-guided prostate artery embolization.   Risks and benefits of prostate artery embolization were discussed with the patient including, but not limited to bleeding, infection, vascular injury or contrast induced renal failure.  All of the patient's questions were answered, patient is agreeable to proceed. He has been NPO.   Consent signed and in chart.   Thank you for this interesting consult.  I greatly enjoyed meeting Adam Noble and look forward to participating in their care.  A copy of this report was sent to the requesting provider on this date.  Electronically Signed: Warren Dais,  AGACNP-BC 01/20/2024, 9:02 AM   I spent a total of  30 Minutes   in face to face in clinical consultation, greater than 50% of which was counseling/coordinating care for benign prostatic hyperplasia.

## 2024-01-20 NOTE — Discharge Instructions (Signed)
Drink plenty of fluids for 48 hours and keep wrist elevated at heart level for 24 hours  Radial Site Care   This sheet gives you information about how to care for yourself after your procedure. Your health care provider may also give you more specific instructions. If you have problems or questions, contact your health care provider. What can I expect after the procedure? After the procedure, it is common to have: Bruising and tenderness at the catheter insertion area. Follow these instructions at home: Medicines Take over-the-counter and prescription medicines only as told by your health care provider. Insertion site care Follow instructions from your health care provider about how to take care of your insertion site. Make sure you: Wash your hands with soap and water before you change your bandage (dressing). If soap and water are not available, use hand sanitizer. Remove your dressing as told by your health care provider. In 24 hours Check your insertion site every day for signs of infection. Check for: Redness, swelling, or pain. Fluid or blood. Pus or a bad smell. Warmth. Do not take baths, swim, or use a hot tub until your health care provider approves. You may shower 24-48 hours after the procedure, or as directed by your health care provider. Remove the dressing and gently wash the site with plain soap and water. Pat the area dry with a clean towel. Do not rub the site. That could cause bleeding. Do not apply powder or lotion to the site. Activity   For 24 hours after the procedure, or as directed by your health care provider: Do not flex or bend the affected arm. Do not push or pull heavy objects with the affected arm. Do not drive yourself home from the hospital or clinic. You may drive 24 hours after the procedure unless your health care provider tells you not to. Do not operate machinery or power tools. Do not lift anything that is heavier than 10 lb (4.5 kg), or the  limit that you are told, until your health care provider says that it is safe.  For 4 days Ask your health care provider when it is okay to: Return to work or school. Resume usual physical activities or sports. Resume sexual activity. General instructions If the catheter site starts to bleed, raise your arm and put firm pressure on the site. If the bleeding does not stop, get help right away. This is a medical emergency. If you went home on the same day as your procedure, a responsible adult should be with you for the first 24 hours after you arrive home. Keep all follow-up visits as told by your health care provider. This is important. Contact a health care provider if: You have a fever. You have redness, swelling, or yellow drainage around your insertion site. Get help right away if: You have unusual pain at the radial site. The catheter insertion area swells very fast. The insertion area is bleeding, and the bleeding does not stop when you hold steady pressure on the area. Your arm or hand becomes pale, cool, tingly, or numb. These symptoms may represent a serious problem that is an emergency. Do not wait to see if the symptoms will go away. Get medical help right away. Call your local emergency services (911 in the [place] Do not drive yourself to the hospital. Summary After the procedure, it is common to have bruising and tenderness at the site. Follow instructions from your health care provider about how to take care of your  radial site wound. Check the wound every day for signs of infection. Do not lift anything that is heavier than 10 lb (4.5 kg), or the limit that you are told, until your health care provider says that it is safe. This information is not intended to replace advice given to you by your health care provider. Make sure you discuss any questions you have with your health care provider. Document Revised: 04/14/2017 Document Reviewed: 04/14/2017 Elsevier Patient Education   2020 [employer] Femoral Site Care This sheet gives you information about how to care for yourself after your procedure. Your health care provider may also give you more specific instructions. If you have problems or questions, contact your health care provider. What can I expect after the procedure?  After the procedure, it is common to have: Bruising that usually fades within 1-2 weeks. Tenderness at the site. Follow these instructions at home: Wound care Follow instructions from your health care provider about how to take care of your insertion site. Make sure you: Wash your hands with soap and water before you change your bandage (dressing). If soap and water are not available, use hand sanitizer. Remove your dressing as told by your health care provider. In 24 hours Do not take baths, swim, or use a hot tub until your health care provider approves. You may shower 24-48 hours after the procedure or as told by your health care provider. Gently wash the site with plain soap and water. Pat the area dry with a clean towel. Do not rub the site. This may cause bleeding. Do not apply powder or lotion to the site. Keep the site clean and dry. Check your femoral site every day for signs of infection. Check for: Redness, swelling, or pain. Fluid or blood. Warmth. Pus or a bad smell. Activity For the first 2-3 days after your procedure, or as long as directed: Avoid climbing stairs as much as possible. Do not squat. Do not lift anything that is heavier than 10 lb (4.5 kg), or the limit that you are told, until your health care provider says that it is safe. For 5 days Rest as directed. Avoid sitting for a long time without moving. Get up to take short walks every 1-2 hours. Do not drive for 24 hours if you were given a medicine to help you relax (sedative). General instructions Take over-the-counter and prescription medicines only as told by your health care provider. Keep all follow-up  visits as told by your health care provider. This is important. Contact a health care provider if you have: A fever or chills. You have redness, swelling, or pain around your insertion site. Get help right away if: The catheter insertion area swells very fast. You pass out. You suddenly start to sweat or your skin gets clammy. The catheter insertion area is bleeding, and the bleeding does not stop when you hold steady pressure on the area. The area near or just beyond the catheter insertion site becomes pale, cool, tingly, or numb. These symptoms may represent a serious problem that is an emergency. Do not wait to see if the symptoms will go away. Get medical help right away. Call your local emergency services (911 in the [place] Do not drive yourself to the hospital. Summary After the procedure, it is common to have bruising that usually fades within 1-2 weeks. Check your femoral site every day for signs of infection. Do not lift anything that is heavier than 10 lb (4.5 kg), or the limit  that you are told, until your health care provider says that it is safe. This information is not intended to replace advice given to you by your health care provider. Make sure you discuss any questions you have with your health care provider. Document Revised: 03/22/2017 Document Reviewed: 03/22/2017 Elsevier Patient Education  2020 [employer].

## 2024-01-20 NOTE — Procedures (Signed)
 Interventional Radiology Procedure Note  Procedure:  Prostate Artery Embolization  Findings: Please refer to procedural dictation for full description. Left radial artery access with poor perfusion after placement, removed and TR band applied.  Right CFA 5 Fr access with 6 Fr Angioseal closure.  Complications: None immediate  Estimated Blood Loss: < 5 mL  Recommendations: Strict 1 hour flat bedrest followed by 2 hours with head of bed up to 30 degrees. Foley out. IR will arrange 1 month outpatient follow up.   Ester Sides, MD

## 2024-02-15 ENCOUNTER — Other Ambulatory Visit: Payer: Self-pay | Admitting: Urology

## 2024-03-03 ENCOUNTER — Encounter (HOSPITAL_COMMUNITY): Payer: Self-pay | Admitting: Urology

## 2024-03-03 NOTE — Progress Notes (Signed)
 Spoke w/ via phone for pre-op interview--- Camellia Lab needs dos---- BMP and EKG per anesthesia.        Lab results------ COVID test -----patient states asymptomatic no test needed Arrive at -------1030 NPO after MN NO Solid Food.  Clear liquids from MN until---0930 Pre-Surgery Ensure or G2:  Med rec completed Medications to take morning of surgery -----NONE Diabetic medication -----  GLP1 agonist last dose: GLP1 instructions:  Patient instructed no nail polish to be worn day of surgery Patient instructed to bring photo id and insurance card day of surgery Patient aware to have Driver (ride ) / caregiver    for 24 hours after surgery - Wife Sion Reinders Patient Special Instructions ----- per surgeon hold ASA 7 days prior to procedure. Pt verbalized understanding. Pre-Op special Instructions -----  Patient verbalized understanding of instructions that were given at this phone interview. Patient denies chest pain, sob, fever, cough at the interview.

## 2024-03-10 ENCOUNTER — Ambulatory Visit (HOSPITAL_COMMUNITY)
Admission: RE | Admit: 2024-03-10 | Discharge: 2024-03-10 | Disposition: A | Discharge status: Home / Self Care | Attending: Urology | Admitting: Urology

## 2024-03-10 ENCOUNTER — Encounter: Admission: RE | Payer: Self-pay

## 2024-03-10 ENCOUNTER — Ambulatory Visit (HOSPITAL_COMMUNITY)

## 2024-03-10 ENCOUNTER — Other Ambulatory Visit: Payer: Self-pay

## 2024-03-10 ENCOUNTER — Encounter (HOSPITAL_COMMUNITY): Payer: Self-pay | Admitting: Urology

## 2024-03-10 DIAGNOSIS — I10 Essential (primary) hypertension: Secondary | ICD-10-CM | POA: Diagnosis not present

## 2024-03-10 DIAGNOSIS — N400 Enlarged prostate without lower urinary tract symptoms: Secondary | ICD-10-CM | POA: Diagnosis not present

## 2024-03-10 DIAGNOSIS — N210 Calculus in bladder: Secondary | ICD-10-CM | POA: Diagnosis not present

## 2024-03-10 DIAGNOSIS — Z79899 Other long term (current) drug therapy: Secondary | ICD-10-CM | POA: Insufficient documentation

## 2024-03-10 DIAGNOSIS — Z9889 Other specified postprocedural states: Secondary | ICD-10-CM | POA: Diagnosis not present

## 2024-03-10 DIAGNOSIS — Z905 Acquired absence of kidney: Secondary | ICD-10-CM | POA: Insufficient documentation

## 2024-03-10 DIAGNOSIS — G4730 Sleep apnea, unspecified: Secondary | ICD-10-CM | POA: Diagnosis not present

## 2024-03-10 DIAGNOSIS — Z85528 Personal history of other malignant neoplasm of kidney: Secondary | ICD-10-CM | POA: Insufficient documentation

## 2024-03-10 HISTORY — PX: CYSTOSCOPY WITH LITHOLAPAXY: SHX1425

## 2024-03-10 LAB — BASIC METABOLIC PANEL WITH GFR
Anion gap: 13 (ref 5–15)
BUN: 18 mg/dL (ref 8–23)
CO2: 20 mmol/L — ABNORMAL LOW (ref 22–32)
Calcium: 9.3 mg/dL (ref 8.9–10.3)
Chloride: 103 mmol/L (ref 98–111)
Creatinine, Ser: 1.05 mg/dL (ref 0.61–1.24)
GFR, Estimated: >60 mL/min
Glucose, Bld: 87 mg/dL (ref 70–99)
Potassium: 3.7 mmol/L (ref 3.5–5.1)
Sodium: 136 mmol/L (ref 135–145)

## 2024-03-10 SURGERY — CYSTOSCOPY, WITH BLADDER CALCULUS LITHOLAPAXY
Anesthesia: General | Site: Bladder

## 2024-03-10 MED ORDER — AMISULPRIDE (ANTIEMETIC) 5 MG/2ML IV SOLN: 10.0000 mg | Freq: Once | INTRAVENOUS | Status: DC | PRN

## 2024-03-10 MED ORDER — OXYCODONE HCL 5 MG/5ML PO SOLN: 5.0000 mg | Freq: Once | ORAL | Status: DC | PRN

## 2024-03-10 MED ORDER — ONDANSETRON HCL 4 MG/2ML IJ SOLN
INTRAMUSCULAR | Status: DC | PRN
  Administered 2024-03-10: 4 mg via INTRAVENOUS

## 2024-03-10 MED ORDER — DEXMEDETOMIDINE HCL IN NACL 80 MCG/20ML IV SOLN
INTRAVENOUS | Status: DC | PRN
  Administered 2024-03-10: 4 ug via INTRAVENOUS

## 2024-03-10 MED ORDER — LACTATED RINGERS IV SOLN: INTRAVENOUS | Status: DC

## 2024-03-10 MED ORDER — GLYCOPYRROLATE PF 0.2 MG/ML IJ SOSY
PREFILLED_SYRINGE | INTRAMUSCULAR | Status: DC | PRN
  Administered 2024-03-10: .1 mg via INTRAVENOUS

## 2024-03-10 MED ORDER — CHLORHEXIDINE GLUCONATE 0.12 % MT SOLN
OROMUCOSAL | Status: AC
  Filled 2024-03-10: qty 15

## 2024-03-10 MED ORDER — FENTANYL CITRATE (PF) 100 MCG/2ML IJ SOLN: 25.0000 ug | INTRAMUSCULAR | Status: DC | PRN

## 2024-03-10 MED ORDER — DEXAMETHASONE SOD PHOSPHATE PF 10 MG/ML IJ SOLN
INTRAMUSCULAR | Status: DC | PRN
  Administered 2024-03-10: 4 mg via INTRAVENOUS

## 2024-03-10 MED ORDER — CEFAZOLIN SODIUM-DEXTROSE 2-4 GM/100ML-% IV SOLN
INTRAVENOUS | Status: AC
  Filled 2024-03-10: qty 100

## 2024-03-10 MED ORDER — LIDOCAINE 2% (20 MG/ML) 5 ML SYRINGE
INTRAMUSCULAR | Status: DC | PRN
  Administered 2024-03-10: 100 mg via INTRAVENOUS

## 2024-03-10 MED ORDER — ACETAMINOPHEN 10 MG/ML IV SOLN: 1000.0000 mg | Freq: Once | INTRAVENOUS | Status: DC | PRN

## 2024-03-10 MED ORDER — PROPOFOL 10 MG/ML IV BOLUS
INTRAVENOUS | Status: DC | PRN
  Administered 2024-03-10: 100 mg via INTRAVENOUS
  Administered 2024-03-10: 50 mg via INTRAVENOUS

## 2024-03-10 MED ORDER — OXYCODONE HCL 5 MG PO TABS: 5.0000 mg | ORAL_TABLET | Freq: Once | ORAL | Status: DC | PRN

## 2024-03-10 MED ORDER — MIDAZOLAM HCL 2 MG/2ML IJ SOLN
INTRAMUSCULAR | Status: AC
  Filled 2024-03-10: qty 2

## 2024-03-10 MED ORDER — CHLORHEXIDINE GLUCONATE 0.12 % MT SOLN
15.0000 mL | Freq: Once | OROMUCOSAL | Status: AC
  Administered 2024-03-10: 15 mL via OROMUCOSAL

## 2024-03-10 MED ORDER — CEFAZOLIN SODIUM-DEXTROSE 2-4 GM/100ML-% IV SOLN
2.0000 g | INTRAVENOUS | Status: AC
  Administered 2024-03-10: 2 g via INTRAVENOUS

## 2024-03-10 MED ORDER — SODIUM CHLORIDE 0.9 % IR SOLN
Status: DC | PRN
  Administered 2024-03-10: 3000 mL

## 2024-03-10 MED ORDER — ORAL CARE MOUTH RINSE: 15.0000 mL | Freq: Once | OROMUCOSAL | Status: AC

## 2024-03-10 MED ORDER — MIDAZOLAM HCL (PF) 2 MG/2ML IJ SOLN
INTRAMUSCULAR | Status: DC | PRN
  Administered 2024-03-10: 1 mg via INTRAVENOUS

## 2024-03-10 MED ORDER — ONDANSETRON HCL 4 MG/2ML IJ SOLN: 4.0000 mg | Freq: Once | INTRAMUSCULAR | Status: DC | PRN

## 2024-03-10 MED ORDER — FENTANYL CITRATE (PF) 100 MCG/2ML IJ SOLN
INTRAMUSCULAR | Status: AC
  Filled 2024-03-10: qty 2

## 2024-03-10 MED ORDER — FENTANYL CITRATE (PF) 250 MCG/5ML IJ SOLN
INTRAMUSCULAR | Status: DC | PRN
  Administered 2024-03-10: 50 ug via INTRAVENOUS

## 2024-03-10 MED ORDER — PROPOFOL 10 MG/ML IV BOLUS
INTRAVENOUS | Status: AC
  Filled 2024-03-10: qty 20

## 2024-03-10 SURGICAL SUPPLY — 18 items
BAG DRAIN URO-CYSTO SKYTR STRL (DRAIN) ×1 IMPLANT
BASKET LASER NITINOL 1.9FR (BASKET) IMPLANT
BASKET ZERO TIP NITINOL 2.4FR (BASKET) IMPLANT
CATH URET 5FR 28IN OPEN ENDED (CATHETERS) ×1 IMPLANT
CATH URETERAL DUAL LUMEN 10F (MISCELLANEOUS) IMPLANT
ELECTRODE REM PT RTRN 9FT ADLT (ELECTROSURGICAL) IMPLANT
EXTRACTOR STONE 1.7FRX115CM (UROLOGICAL SUPPLIES) IMPLANT
FIBER LASER TRACTIP 200 (UROLOGICAL SUPPLIES) IMPLANT
GLOVE BIO SURGEON STRL SZ7.5 (GLOVE) ×1 IMPLANT
GOWN STRL REUS W/ TWL XL LVL3 (GOWN DISPOSABLE) ×1 IMPLANT
GUIDEWIRE ANG ZIPWIRE 038X150 (WIRE) IMPLANT
GUIDEWIRE STR DUAL SENSOR (WIRE) ×1 IMPLANT
KIT TURNOVER KIT B (KITS) ×1 IMPLANT
MANIFOLD NEPTUNE II (INSTRUMENTS) ×1 IMPLANT
PACK CYSTO (CUSTOM PROCEDURE TRAY) ×1 IMPLANT
SET IRRIG Y-TYPE CYSTO (SET/KITS/TRAYS/PACK) ×1 IMPLANT
SOL .9 NS 3000ML IRR UROMATIC (IV SOLUTION) ×2 IMPLANT
TUBE CONNECTING 12X1/4 (SUCTIONS) IMPLANT

## 2024-03-10 NOTE — Anesthesia Procedure Notes (Signed)
 Procedure Name: LMA Insertion Date/Time: 03/10/2024 12:16 PM  Performed by: Erick Fitz, CRNAPre-anesthesia Checklist: Patient identified, Emergency Drugs available, Suction available, Patient being monitored and Timeout performed Patient Re-evaluated:Patient Re-evaluated prior to induction Oxygen Delivery Method: Circle system utilized Preoxygenation: Pre-oxygenation with 100% oxygen Induction Type: IV induction Ventilation: Mask ventilation without difficulty LMA: LMA inserted LMA Size: 4.0 Number of attempts: 1 Placement Confirmation: positive ETCO2, CO2 detector and breath sounds checked- equal and bilateral Tube secured with: Tape Dental Injury: Teeth and Oropharynx as per pre-operative assessment

## 2024-03-10 NOTE — Anesthesia Preprocedure Evaluation (Signed)
"                                    Anesthesia Evaluation  Patient identified by MRN, date of birth, ID band Patient awake    Reviewed: Allergy & Precautions, NPO status , Patient's Chart, lab work & pertinent test results  History of Anesthesia Complications Negative for: history of anesthetic complications  Airway Mallampati: II  TM Distance: >3 FB Neck ROM: Full    Dental  (+) Teeth Intact, Dental Advisory Given   Pulmonary sleep apnea and Continuous Positive Airway Pressure Ventilation    breath sounds clear to auscultation       Cardiovascular hypertension, Pt. on medications  Rhythm:Regular Rate:Normal     Neuro/Psych    GI/Hepatic   Endo/Other    Renal/GU R Renal Cell Carcinoma s/p Partial Nephrectomy   BPH s/p Prostate Artery Embolization    Musculoskeletal   Abdominal   Peds  Hematology   Anesthesia Other Findings   Reproductive/Obstetrics                              Anesthesia Physical Anesthesia Plan  ASA: 2  Anesthesia Plan: General   Post-op Pain Management:    Induction: Intravenous  PONV Risk Score and Plan: 2 and Ondansetron , Dexamethasone  and Treatment may vary due to age or medical condition  Airway Management Planned: LMA  Additional Equipment: None  Intra-op Plan:   Post-operative Plan: Extubation in OR  Informed Consent: I have reviewed the patients History and Physical, chart, labs and discussed the procedure including the risks, benefits and alternatives for the proposed anesthesia with the patient or authorized representative who has indicated his/her understanding and acceptance.     Dental advisory given  Plan Discussed with: CRNA and Surgeon  Anesthesia Plan Comments:          Anesthesia Quick Evaluation  "

## 2024-03-10 NOTE — Anesthesia Postprocedure Evaluation (Signed)
"   Anesthesia Post Note  Patient: Adam Noble  Procedure(s) Performed: CYSTOSCOPY, WITH BLADDER CALCULUS LITHOLAPAXY (Bladder)     Patient location during evaluation: PACU Anesthesia Type: General Level of consciousness: awake Pain management: pain level controlled Vital Signs Assessment: post-procedure vital signs reviewed and stable Respiratory status: spontaneous breathing Cardiovascular status: blood pressure returned to baseline Postop Assessment: no apparent nausea or vomiting Anesthetic complications: no   There were no known notable events for this encounter.  Last Vitals:  Vitals:   03/10/24 1300 03/10/24 1315  BP: 133/75 122/72  Pulse: 65 (!) 54  Resp: 14 11  Temp:    SpO2: 97% 98%    Last Pain:  Vitals:   03/10/24 1315  TempSrc:   PainSc: 0-No pain                 Raveena Hebdon T Colhoun      "

## 2024-03-10 NOTE — Discharge Instructions (Signed)
 CYSTOSCOPY HOME CARE INSTRUCTIONS  Activity: Rest for the remainder of the day.  Do not drive or operate equipment today.  You may resume normal activities in one to two days as instructed by your physician.   Meals: Drink plenty of liquids and eat light foods such as gelatin or soup this evening.  You may return to a normal meal plan tomorrow.  Return to Work: You may return to work in one to two days or as instructed by your physician.  Special Instructions / Symptoms: Call your physician if any of these symptoms occur:   -persistent or heavy bleeding  -bleeding which continues after first few urination  -large blood clots that are difficult to pass  -urine stream diminishes or stops completely  -fever equal to or higher than 101 degrees Farenheit.  -cloudy urine with a strong, foul odor  -severe pain  Females should always wipe from front to back after elimination.  You may feel some burning pain when you urinate.  This should disappear with time.  Applying moist heat to the lower abdomen or a hot tub bath may help relieve the pain. \

## 2024-03-10 NOTE — Transfer of Care (Signed)
 Immediate Anesthesia Transfer of Care Note  Patient: Adam Noble  Procedure(s) Performed: CYSTOSCOPY, WITH BLADDER CALCULUS LITHOLAPAXY (Bladder)  Patient Location: PACU  Anesthesia Type:General  Level of Consciousness: awake, alert , oriented, and patient cooperative  Airway & Oxygen Therapy: Patient Spontanous Breathing and Patient connected to face mask oxygen  Post-op Assessment: Report given to RN and Post -op Vital signs reviewed and stable  Post vital signs: Reviewed and stable  Last Vitals:  Vitals Value Taken Time  BP 135/80 03/10/24 12:45  Temp    Pulse 58 03/10/24 12:45  Resp 14 03/10/24 12:45  SpO2 99 % 03/10/24 12:45  Vitals shown include unfiled device data.  Last Pain:  Vitals:   03/10/24 1042  TempSrc: Oral  PainSc: 0-No pain      Patients Stated Pain Goal: 5 (03/10/24 1042)  Complications: There were no known notable events for this encounter.

## 2024-03-10 NOTE — Interval H&P Note (Signed)
 History and Physical Interval Note:  03/10/2024 11:36 AM  Adam Noble  has presented today for surgery, with the diagnosis of BLADDER CALCULUS.  The various methods of treatment have been discussed with the patient and family. After consideration of risks, benefits and other options for treatment, the patient has consented to  Procedures with comments: CYSTOSCOPY, WITH BLADDER CALCULUS LITHOLAPAXY (N/A) - CYSTOLITHOLAPAXY as a surgical intervention.  The patient's history has been reviewed, patient examined, no change in status, stable for surgery.  I have reviewed the patient's chart and labs.  Questions were answered to the patient's satisfaction.     [name]

## 2024-03-10 NOTE — Op Note (Signed)
 Preoperative diagnosis:  1 cm bladder stone  Postoperative diagnosis:  Same  Procedure: Cystoscopy, cystolitholopaxy, 1 cm  Surgeon: Morene MICAEL Salines, MD  Anesthesia: General  Complications: None  Intraoperative findings:  #1: The patient's prostate was obstructing, the right lateral lobe greater than the left lateral lobe.  He had a fairly high median bar.  The bladder was otherwise normal in appearance with normal mucosa and orthotopic ureteral orifice ease.  1 cm stone was noted floating in the bladder. #2: I was able to cross the stone with a ZeroTip basket and remove it intact.  EBL: Minimal  Specimens: Stone was sent home with the patient  Indication: JONNATHAN BIRMAN is a 68 y.o. patient with bladder stone, too big to remove in clinic.  After reviewing the management options for treatment, he elected to proceed with the above surgical procedure(s). We have discussed the potential benefits and risks of the procedure, side effects of the proposed treatment, the likelihood of the patient achieving the goals of the procedure, and any potential problems that might occur during the procedure or recuperation. Informed consent has been obtained.  Description of procedure:  Consent was obtained the preoperative holding area.  He was brought back to the op room and placed on table in supine position.  General esthesia was then induced and the LMA was inserted.  Placed in the dorsolithotomy position prepped and draped in the routine sterile fashion.  Timeout subsequently performed.  23 French 3 degree cystoscope gently passed through the patient's urethra into the bladder under visual guidance.  The above findings were noted.  Stone was easily grasped with a ZeroTip basket and removed intact.  There was a little bit of bleeding at the bladder neck.  As such, I placed a 16 French Foley catheter atraumatically.  10 cc of sterile water  was used to inflate the balloon.  The patient was  subsequently awoken and returned to PACU stable condition.

## 2024-03-11 ENCOUNTER — Encounter (HOSPITAL_COMMUNITY): Payer: Self-pay | Admitting: Urology

## 2024-03-22 ENCOUNTER — Other Ambulatory Visit: Payer: Self-pay | Admitting: Interventional Radiology

## 2024-03-22 DIAGNOSIS — N401 Enlarged prostate with lower urinary tract symptoms: Secondary | ICD-10-CM

## 2024-03-23 NOTE — Progress Notes (Signed)
 "  Referring Physician(s): Herrick,Benjamin W   Chief Complaint: The patient is seen in follow up today s/p prostate artery embolization 01/20/24  History of present illness: HPI from initial consultation 11/26/23 Adam Noble is a 69 y.o. male with a medical history significant for right renal cell carcinoma (2022; partial nephrectomy), bladder stone and BPH with an elevated PSA which has remained stable over the years. He is followed by Urology for intermittent gross hematuria. The patient also struggles with urgency, frequency and difficulty emptying his bladder - all symptoms that have progressively worsened. These symptoms started nearly a decade ago.  His main complaints are incomplete emptying, straining to void, and weak stream.  He denies any prior episodes of acute urinary retention.   He has taken Finasteride for many years. At his last visit with his Urologist the patient expressed an interest in pursuing treatment for his BPH/LUTS. Dr. Cam discussed multiple options including TURP, Aqua ablation and prostate artery embolization. After reviewing procedure details, risks and benefits the patient is most interested in PAE.   He was considered an excellent candidate for prostate artery embolization and we reviewed the procedure details, risks, benefits and long-term expected outcomes. He was agreeable to proceed. On 01/20/24 he underwent a technically successful bilateral prostate artery embolization. He tolerated the procedure well and was discharged home the same day.   He presents today to the IR outpatient clinic for follow up. He was taken to the OR 03/10/24 with Dr. Cam for retrieval of a large bladder stone. IPSS-QoL today is 8-2, 24-4 prior to procedure.  He continues to complain of a weak stream, which though improved, is not as good as he was expecting.  He is doing well after bladder stone removal.  He follows with Dr. Cam today.   Past Medical History:  Diagnosis  Date   BPH (benign prostatic hypertrophy)    Cancer (HCC)    Contact lens/glasses fitting    wears contacts or glasses   Hyperlipemia    Hypertension    Sleep apnea    wears CPAP    Past Surgical History:  Procedure Laterality Date   ACHILLES TENDON SURGERY Right 11/03/2012   Procedure: RIGHT ACHILLES TENDON DEBRIDEMENT/RECONSTRUCTION ;  Surgeon: Norleen Armor, MD;  Location: [facility];  Service: Orthopedics;  Laterality: Right;   COLONOSCOPY     CYSTOSCOPY WITH LITHOLAPAXY N/A 03/10/2024   Procedure: CYSTOSCOPY, WITH BLADDER CALCULUS LITHOLAPAXY;  Surgeon: Cam Morene ORN, MD;  Location: [facility] OR;  Service: Urology;  Laterality: N/A;   GASTROCNEMIUS RECESSION Right 11/03/2012   Procedure: GASTROC RECESSION;  Surgeon: Norleen Armor, MD;  Location: [facility];  Service: Orthopedics;  Laterality: Right;   IR ANGIOGRAM SELECTIVE EACH ADDITIONAL VESSEL  01/20/2024   IR ANGIOGRAM VISCERAL SELECTIVE  01/20/2024   IR ANGIOGRAM VISCERAL SELECTIVE  01/20/2024   IR EMBO ARTERIAL NOT HEMORR HEMANG INC GUIDE ROADMAPPING  01/20/2024   IR RADIOLOGIST EVAL & MGMT  11/26/2023   IR US  GUIDE VASC ACCESS LEFT  01/20/2024   IR US  GUIDE VASC ACCESS LEFT  01/20/2024   IR US  GUIDE VASC ACCESS LEFT  01/20/2024   IR US  GUIDE VASC ACCESS RIGHT  01/20/2024   LIPOMA EXCISION Left 02/02/2022   Procedure: EXCISION OF LIPOMA LEFT UPPER BACK;  Surgeon: Lyndel Deward PARAS, MD;  Location: [facility];  Service: General;  Laterality: Left;   ORIF ANKLE FRACTURE  03/24/2007   left   ROBOTIC ASSITED PARTIAL NEPHRECTOMY  Right 09/19/2020   Procedure: XI ROBOTIC ASSITED RIGHT PARTIAL NEPHRECTOMY;  Surgeon: Cam Morene ORN, MD;  Location: [facility];  Service: Urology;  Laterality: Right;   TENDON TRANSFER Right 11/03/2012   Procedure: FLEXOR HALLUCIS LONGUS TRANSFER TO CALCANEUS ;  Surgeon: Norleen Armor, MD;  Location: [facility];  Service: Orthopedics;   Laterality: Right;   TONSILLECTOMY      Allergies: Patient has no known allergies.  Medications: Prior to Admission medications  Medication Sig Start Date End Date Taking? Authorizing Provider  aspirin  EC 81 MG tablet Take 81 mg by mouth daily. Swallow whole.    [provider]  atorvastatin  (LIPITOR) 20 MG tablet Take 20 mg by mouth daily.    [provider]  azelastine  (ASTELIN ) 0.1 % nasal spray Place 1 spray into both nostrils 2 (two) times daily. Use in each nostril as directed Patient not taking: Reported on 03/10/2024    [provider]  finasteride (PROSCAR) 5 MG tablet Take 5 mg by mouth daily.    [provider]  fluticasone  (FLONASE ) 50 MCG/ACT nasal spray USE 1 TO 2 SPRAYS IN EACH NOSTRIL ONCE DAILY AS DIRECTED Patient not taking: Reported on 03/10/2024 02/08/15   Kozlow, Raymon J, MD  glucosamine-chondroitin 500-400 MG tablet Take 1 tablet by mouth daily.    [provider]  hydrochlorothiazide (HYDRODIURIL) 25 MG tablet Take 25 mg by mouth daily.    [provider]  methylPREDNISolone  (MEDROL  DOSEPAK) 4 MG TBPK tablet Dispense per instructions on the box Patient not taking: Reported on 03/10/2024 01/17/24   Covington, Jamie R, NP  oxymetazoline  (AFRIN) 0.05 % nasal spray Place 1 spray into both nostrils at bedtime as needed for congestion.    [provider]     No family history on file.  Social History   Socioeconomic History   Marital status: Married    Spouse name: Not on file   Number of children: Not on file   Years of education: Not on file   Highest education level: Not on file  Occupational History   Not on file  Tobacco Use   Smoking status: Never   Smokeless tobacco: Never  Vaping Use   Vaping status: Never Used  Substance and Sexual Activity   Alcohol  use: Yes    Comment: occ   Drug use: No   Sexual activity: Not on file  Other Topics Concern   Not on file  Social History Narrative    Not on file   Social Drivers of Health   Tobacco Use: Low Risk (03/10/2024)   Patient History    Smoking Tobacco Use: Never    Smokeless Tobacco Use: Never    Passive Exposure: Not on file  Financial Resource Strain: Not on file  Food Insecurity: Not on file  Transportation Needs: Not on file  Physical Activity: Not on file  Stress: Not on file  Social Connections: Unknown (08/01/2021)   [facility]   Social Network    Social Network: Not on file  Depression (PHQ2-9): Not on file  Alcohol  Screen: Not on file  Housing: Not on file  Utilities: Not on file  Health Literacy: Not on file     Vital Signs: There were no vitals taken for this visit.  Physical Exam Constitutional:      General: He is not in acute distress. HENT:     Head: Normocephalic.     Mouth/Throat:     Mouth: Mucous membranes are  moist.  Eyes:     General: No scleral icterus. Cardiovascular:     Rate and Rhythm: Normal rate and regular rhythm.  Pulmonary:     Effort: No respiratory distress.  Abdominal:     General: There is no distension.  Skin:    General: Skin is warm and dry.  Neurological:     Mental Status: He is alert and oriented to person, place, and time.     Imaging: CT Pelvis 03/26/23  ~76 g  Labs:  CBC: Recent Labs    01/20/24 0748  WBC 6.0  HGB 15.5  HCT 45.9  PLT 228    COAGS: Recent Labs    01/20/24 0748  INR 1.0    BMP: Recent Labs    01/20/24 0748 03/10/24 1117  NA 141 136  K 3.9 3.7  CL 103 103  CO2 25 20*  GLUCOSE 89 87  BUN 15 18  CALCIUM  9.3 9.3  CREATININE 1.22 1.05  GFRNONAA >60 >60    LIVER FUNCTION TESTS: No results for input(s): BILITOT, AST, ALT, ALKPHOS, PROT, ALBUMIN in the last 8760 hours.  Assessment and Plan: 69 year old male with a history of benign prostatic hyperplasia (76 g) with progressively worsening lower urinary tract symptoms. He underwent a technically successful bilateral prostate artery  embolization 01/20/24. IPSS-QoL has improved from 24-4 to 8-2.  He is overall satisfied with the results but is still having some issues with weak stream.  He continues to follow with Dr. Cam.  Follow up with IR as needed.  Ester Sides, MD Pager: [phone]    I spent a total of 25 Minutes in face to face in clinical consultation, greater than 50% of which was counseling/coordinating care for benign prostatic hyperplasia.      "

## 2024-03-24 ENCOUNTER — Ambulatory Visit
Admission: RE | Admit: 2024-03-24 | Discharge: 2024-03-24 | Disposition: A | Source: Ambulatory Visit | Attending: Interventional Radiology

## 2024-03-24 DIAGNOSIS — N401 Enlarged prostate with lower urinary tract symptoms: Secondary | ICD-10-CM

## 2024-03-24 HISTORY — PX: IR RADIOLOGIST EVAL & MGMT: IMG5224
